# Patient Record
Sex: Female | Born: 1980 | Race: Black or African American | Hispanic: No | Marital: Single | Smoking: Former smoker
Health system: Southern US, Community
[De-identification: ages and names within clinical notes are randomized; demographics above are authoritative.]

## PROBLEM LIST (undated history)

## (undated) DIAGNOSIS — J302 Other seasonal allergic rhinitis: Secondary | ICD-10-CM

## (undated) DIAGNOSIS — I10 Essential (primary) hypertension: Secondary | ICD-10-CM

## (undated) DIAGNOSIS — E669 Obesity, unspecified: Secondary | ICD-10-CM

## (undated) HISTORY — DX: Essential (primary) hypertension: I10

## (undated) HISTORY — DX: Other seasonal allergic rhinitis: J30.2

## (undated) HISTORY — PX: TUBAL LIGATION: SHX77

## (undated) HISTORY — DX: Obesity, unspecified: E66.9

---

## 2004-10-13 ENCOUNTER — Observation Stay: Payer: Self-pay

## 2004-12-07 ENCOUNTER — Observation Stay: Payer: Self-pay

## 2005-01-12 ENCOUNTER — Inpatient Hospital Stay: Payer: Self-pay

## 2009-10-23 ENCOUNTER — Ambulatory Visit: Payer: Self-pay | Admitting: Family Medicine

## 2010-04-30 ENCOUNTER — Ambulatory Visit: Payer: Self-pay | Admitting: Family Medicine

## 2010-10-29 ENCOUNTER — Emergency Department: Payer: Self-pay | Admitting: Emergency Medicine

## 2011-11-16 ENCOUNTER — Emergency Department: Payer: Self-pay | Admitting: Unknown Physician Specialty

## 2013-07-23 ENCOUNTER — Emergency Department: Payer: Self-pay | Admitting: Emergency Medicine

## 2013-07-23 LAB — URINALYSIS, COMPLETE
Nitrite: NEGATIVE
Protein: NEGATIVE
RBC,UR: 2 /HPF (ref 0–5)
Squamous Epithelial: 3
WBC UR: 2 /HPF (ref 0–5)

## 2013-07-23 LAB — CBC
HCT: 38 % (ref 35.0–47.0)
HGB: 12.8 g/dL (ref 12.0–16.0)
Platelet: 208 10*3/uL (ref 150–440)
RBC: 4.36 10*6/uL (ref 3.80–5.20)

## 2013-07-23 LAB — COMPREHENSIVE METABOLIC PANEL
Albumin: 3.9 g/dL (ref 3.4–5.0)
Alkaline Phosphatase: 80 U/L (ref 50–136)
Anion Gap: 4 — ABNORMAL LOW (ref 7–16)
Bilirubin,Total: 0.4 mg/dL (ref 0.2–1.0)
Calcium, Total: 9 mg/dL (ref 8.5–10.1)
Chloride: 110 mmol/L — ABNORMAL HIGH (ref 98–107)
Creatinine: 0.94 mg/dL (ref 0.60–1.30)
EGFR (African American): >60
EGFR (Non-African Amer.): >60
Osmolality: 278 (ref 275–301)
SGOT(AST): 25 U/L (ref 15–37)
SGPT (ALT): 19 U/L (ref 12–78)
Sodium: 140 mmol/L (ref 136–145)
Total Protein: 8.2 g/dL (ref 6.4–8.2)

## 2013-07-23 LAB — LIPASE, BLOOD: Lipase: 147 U/L (ref 73–393)

## 2014-01-19 ENCOUNTER — Emergency Department: Payer: Self-pay | Admitting: Internal Medicine

## 2014-04-24 ENCOUNTER — Emergency Department: Payer: Self-pay | Admitting: Emergency Medicine

## 2014-04-24 LAB — URINALYSIS, COMPLETE
Bacteria: NONE SEEN
Bilirubin,UR: NEGATIVE
Glucose,UR: NEGATIVE mg/dL (ref 0–75)
Leukocyte Esterase: NEGATIVE
Nitrite: NEGATIVE
Ph: 5 (ref 4.5–8.0)
Protein: >=500
RBC,UR: 4 /HPF (ref 0–5)
Specific Gravity: 1.01 (ref 1.003–1.030)
Squamous Epithelial: 1
WBC UR: 2 /HPF (ref 0–5)

## 2014-04-24 LAB — COMPREHENSIVE METABOLIC PANEL
AST: 60 U/L — AB (ref 15–37)
Albumin: 4.1 g/dL (ref 3.4–5.0)
Alkaline Phosphatase: 70 U/L
Anion Gap: 6 — ABNORMAL LOW (ref 7–16)
BUN: 11 mg/dL (ref 7–18)
Bilirubin,Total: 0.4 mg/dL (ref 0.2–1.0)
CO2: 25 mmol/L (ref 21–32)
CREATININE: 1.25 mg/dL (ref 0.60–1.30)
Calcium, Total: 9 mg/dL (ref 8.5–10.1)
Chloride: 102 mmol/L (ref 98–107)
GFR CALC NON AF AMER: 56 — AB
GLUCOSE: 93 mg/dL (ref 65–99)
OSMOLALITY: 265 (ref 275–301)
Potassium: 3.5 mmol/L (ref 3.5–5.1)
SGPT (ALT): 30 U/L (ref 12–78)
Sodium: 133 mmol/L — ABNORMAL LOW (ref 136–145)
Total Protein: 9.1 g/dL — ABNORMAL HIGH (ref 6.4–8.2)

## 2014-04-24 LAB — CBC WITH DIFFERENTIAL/PLATELET
BASOS ABS: 0 10*3/uL (ref 0.0–0.1)
BASOS PCT: 0.7 %
Eosinophil #: 0 10*3/uL (ref 0.0–0.7)
Eosinophil %: 0 %
HCT: 42.9 % (ref 35.0–47.0)
HGB: 14.1 g/dL (ref 12.0–16.0)
Lymphocyte #: 0.7 10*3/uL — ABNORMAL LOW (ref 1.0–3.6)
Lymphocyte %: 32.1 %
MCH: 27.9 pg (ref 26.0–34.0)
MCHC: 32.9 g/dL (ref 32.0–36.0)
MCV: 85 fL (ref 80–100)
Monocyte #: 0.2 x10 3/mm (ref 0.2–0.9)
Monocyte %: 9.9 %
Neutrophil #: 1.3 10*3/uL — ABNORMAL LOW (ref 1.4–6.5)
Neutrophil %: 57.3 %
PLATELETS: 128 10*3/uL — AB (ref 150–440)
RBC: 5.05 10*6/uL (ref 3.80–5.20)
RDW: 13.8 % (ref 11.5–14.5)
WBC: 2.3 10*3/uL — ABNORMAL LOW (ref 3.6–11.0)

## 2014-04-24 LAB — LIPASE, BLOOD: LIPASE: 213 U/L (ref 73–393)

## 2014-04-24 LAB — PREGNANCY, URINE: Pregnancy Test, Urine: NEGATIVE m[IU]/mL

## 2014-04-24 LAB — CLOSTRIDIUM DIFFICILE(ARMC)

## 2014-04-25 LAB — WBCS, STOOL

## 2014-04-27 ENCOUNTER — Emergency Department: Payer: Self-pay | Admitting: Emergency Medicine

## 2015-06-11 IMAGING — CT CT ABD-PELV W/ CM
2 of 4 series · 17 of 46 positions shown, 19 images · IV contrast (isovue)
Comparison: 11/16/2011 CT

CLINICAL DATA: 33-year-old with abdominal and pelvic, nausea,
diarrhea and fever.

EXAM:
CT ABDOMEN AND PELVIS WITH CONTRAST
TECHNIQUE: Multidetector CT imaging of the abdomen and pelvis was performed
using the standard protocol following bolus administration of
intravenous contrast.
CONTRAST:  125 cc intravenous Isovue 370

[Series 2: routine abd pel with · axial · 0.91mm/px · z∈[-644,-200]mm · 14 of 99 slices shown, 16 images]
[im 5/99  soft-tissue]
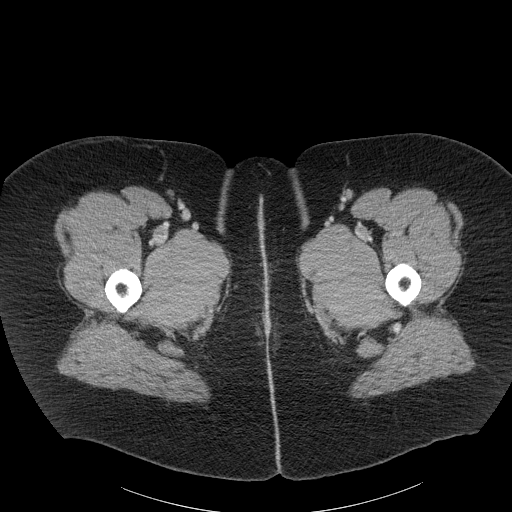
[im 5/99  bone]
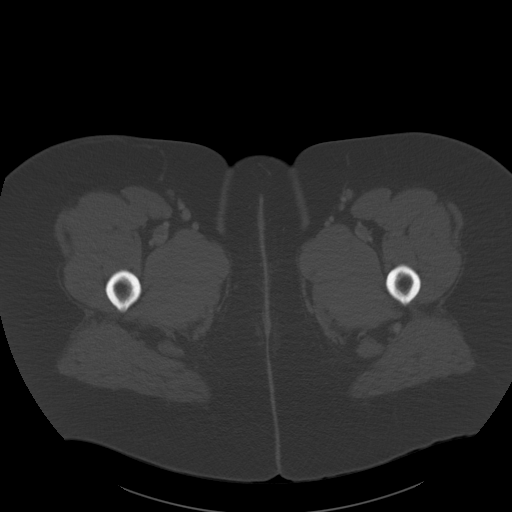
[im 13/99  soft-tissue]
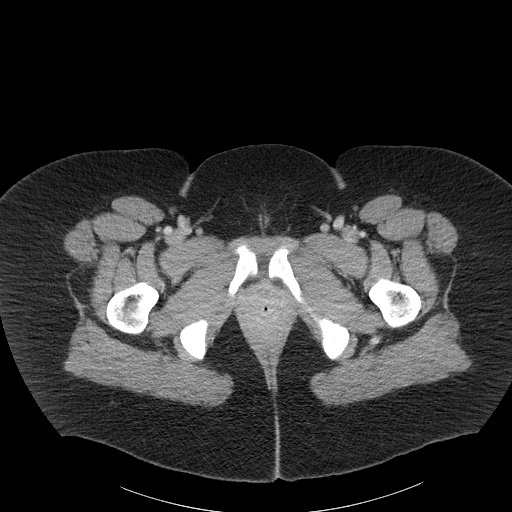
[im 18/99  soft-tissue]
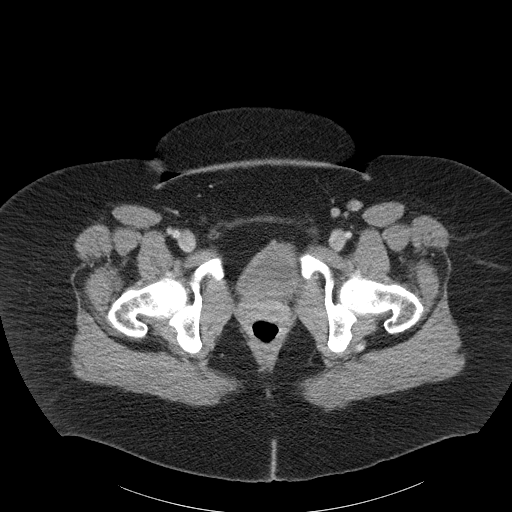
[im 26/99  soft-tissue]
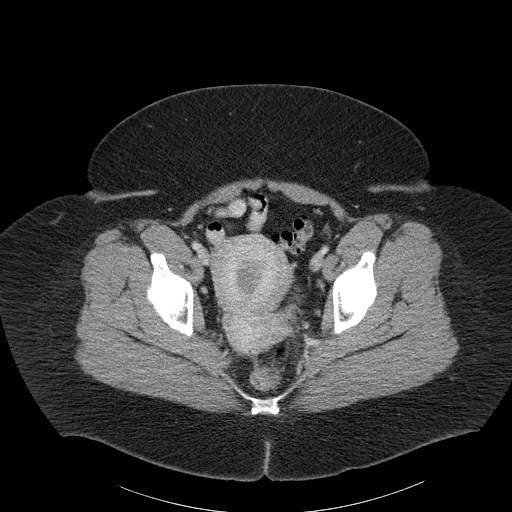
[im 35/99  soft-tissue]
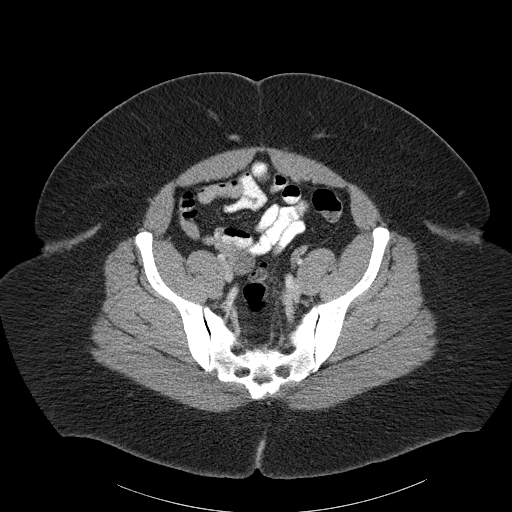
[im 39/99  soft-tissue]
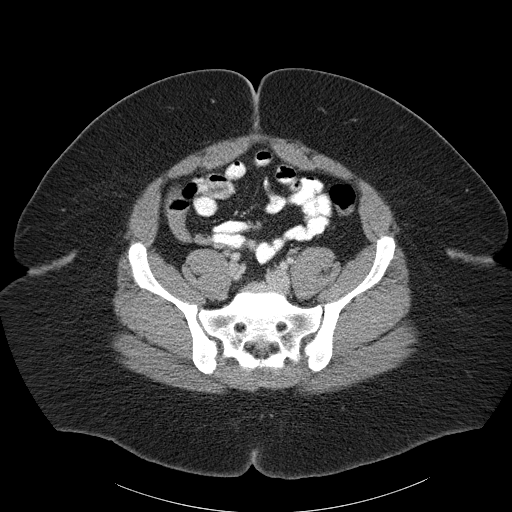
[im 47/99  soft-tissue]
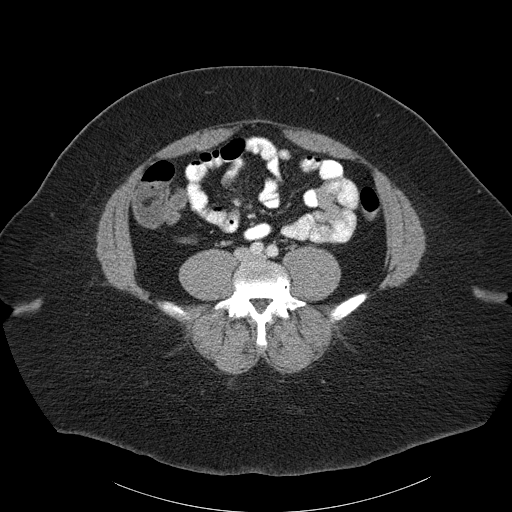
[im 52/99  soft-tissue]
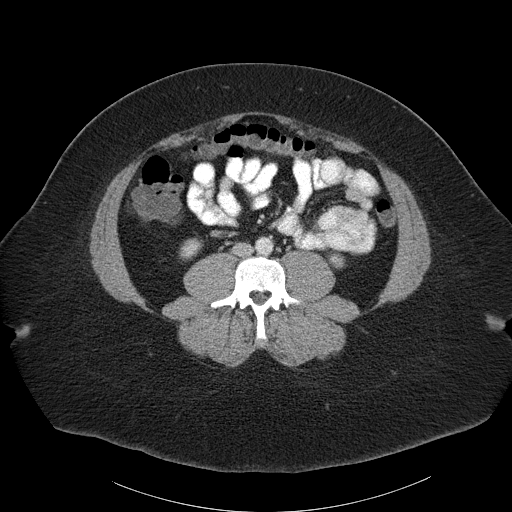
[im 60/99  soft-tissue]
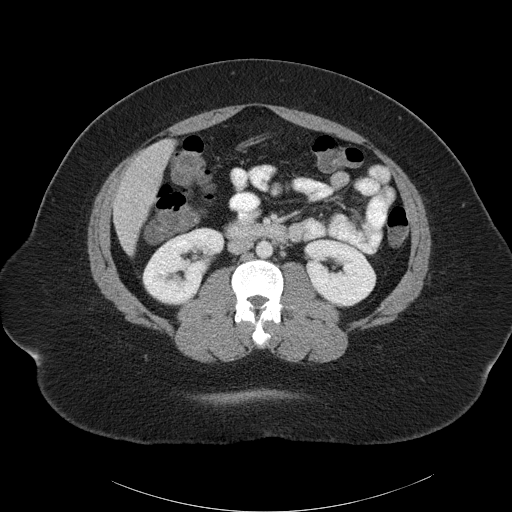
[im 60/99  bone]
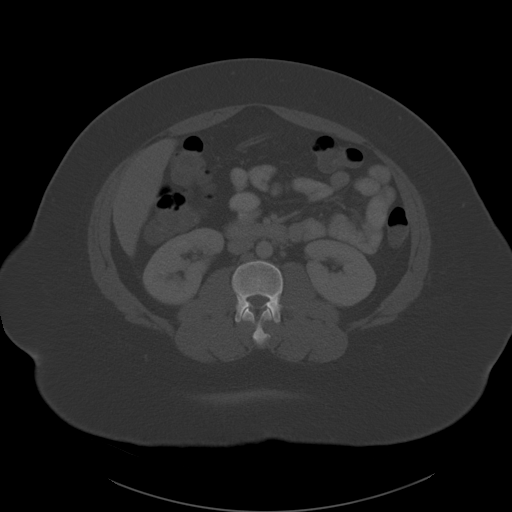
[im 64/99  soft-tissue]
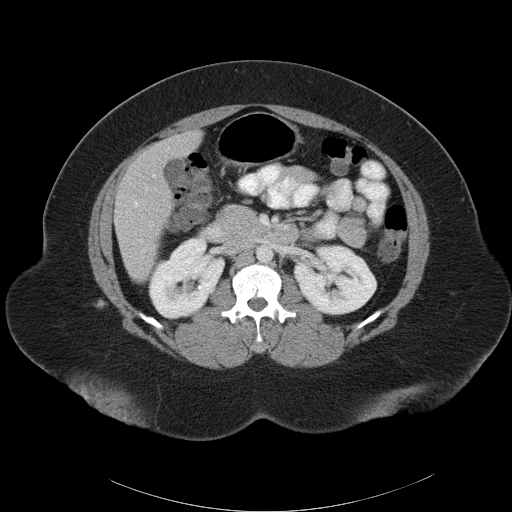
[im 73/99  soft-tissue]
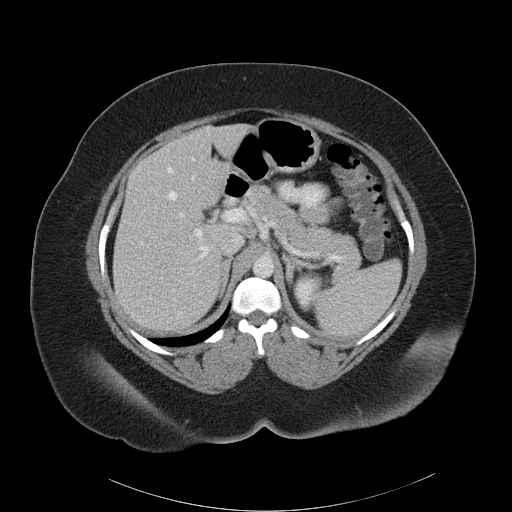
[im 81/99  soft-tissue]
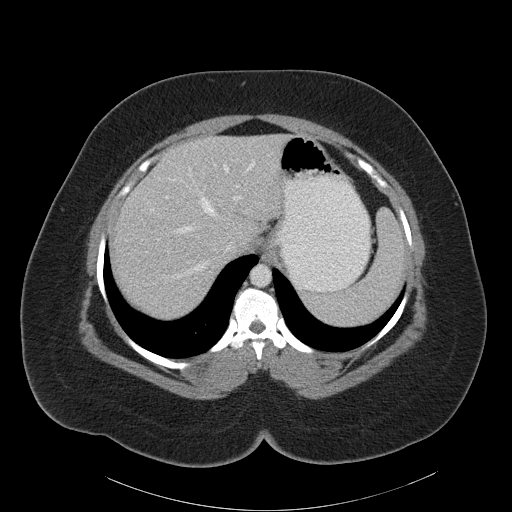
[im 86/99  soft-tissue]
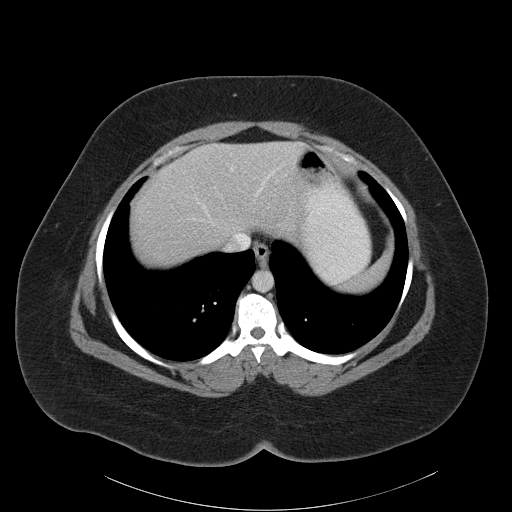
[im 94/99  soft-tissue]
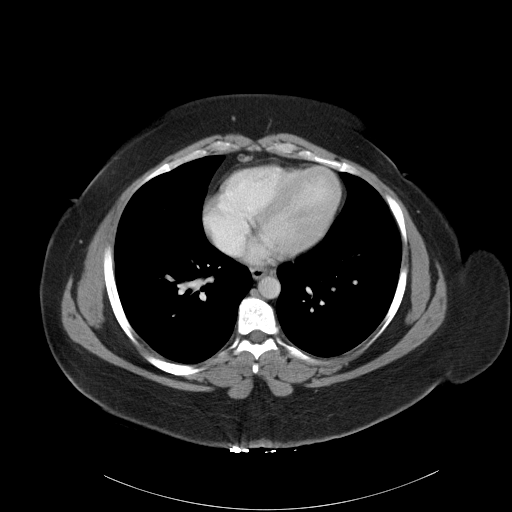

[Series 5: cor routine abd pel with · coronal · 0.92mm/px · 3 of 138 slices shown]
[im 46/138  soft-tissue]
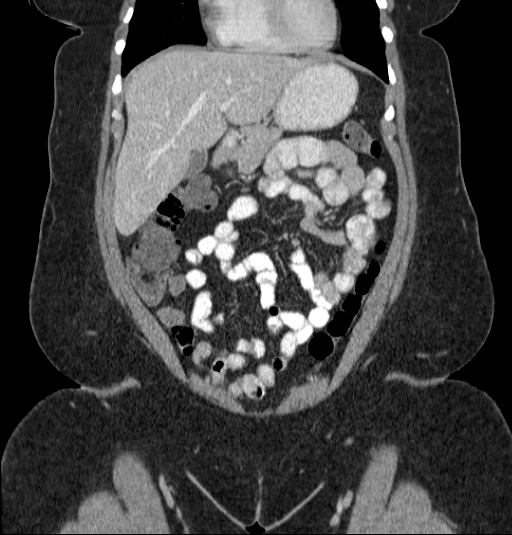
[im 61/138  soft-tissue]
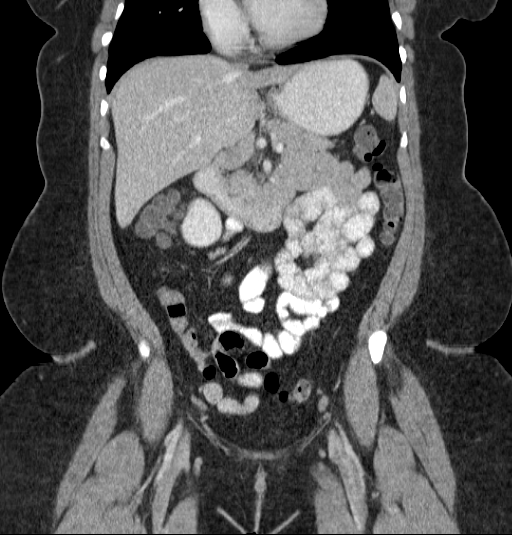
[im 77/138  soft-tissue]
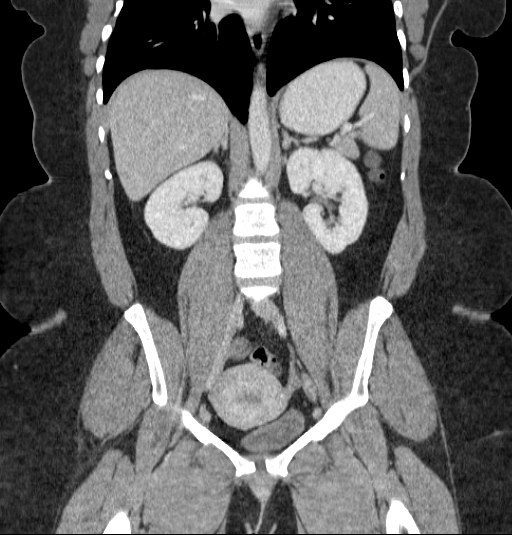

[17 of 46 positions shown; findings below may reference images not displayed]

FINDINGS: The liver, spleen, pancreas, adrenal glands, gallbladder and kidneys
are unremarkable.

There is no evidence of free fluid, enlarged lymph nodes, biliary
dilation or abdominal aortic aneurysm.

The bowel, bladder and appendix are unremarkable. There is no
evidence of bowel obstruction, abscess or pneumoperitoneum.

The uterus and adnexal regions are unremarkable.

No acute or suspicious bony abnormalities are identified.
IMPRESSION: No evidence of acute or significant abnormality.

## 2021-01-19 ENCOUNTER — Encounter: Payer: Self-pay | Admitting: Intensive Care

## 2021-01-19 ENCOUNTER — Other Ambulatory Visit: Payer: Self-pay

## 2021-01-19 ENCOUNTER — Emergency Department
Admission: EM | Admit: 2021-01-19 | Discharge: 2021-01-19 | Disposition: A | Discharge status: Home / Self Care | Payer: Self-pay | Attending: Emergency Medicine | Admitting: Emergency Medicine

## 2021-01-19 DIAGNOSIS — L0201 Cutaneous abscess of face: Secondary | ICD-10-CM | POA: Insufficient documentation

## 2021-01-19 DIAGNOSIS — Z79899 Other long term (current) drug therapy: Secondary | ICD-10-CM | POA: Insufficient documentation

## 2021-01-19 DIAGNOSIS — R519 Headache, unspecified: Secondary | ICD-10-CM | POA: Insufficient documentation

## 2021-01-19 DIAGNOSIS — F17210 Nicotine dependence, cigarettes, uncomplicated: Secondary | ICD-10-CM | POA: Insufficient documentation

## 2021-01-19 MED ORDER — TRAMADOL HCL 50 MG PO TABS: 50.0000 mg | ORAL_TABLET | Freq: Four times a day (QID) | ORAL | 0 refills | Status: DC | PRN

## 2021-01-19 MED ORDER — SULFAMETHOXAZOLE-TRIMETHOPRIM 800-160 MG PO TABS: 1.0000 | ORAL_TABLET | Freq: Two times a day (BID) | ORAL | 0 refills | Status: DC

## 2021-01-19 NOTE — ED Triage Notes (Signed)
Patient c/o boil that presented on right side of face 4 days ago and now swelling. C/o headaches intermittently.

## 2021-01-19 NOTE — ED Provider Notes (Signed)
[facility] Emergency Department Provider Note  ____________________________________________   Event Date/Time   First MD Initiated Contact with Patient 01/19/21 1209     (approximate)  I have reviewed the triage vital signs and the nursing notes.   HISTORY  Chief Complaint Abscess   HPI Stacy Davenport is a 40 y.o. female presents to the ED with complaint of a tenderness to the right side of her face that started 4 days ago and now is swelling.  Patient denies any injury to her face.  She denies any fever, chills, nausea or vomiting.  She rates her pain as an 8 out of 10.       History reviewed. No pertinent past medical history.  There are no problems to display for this patient.   History reviewed. No pertinent surgical history.  Prior to Admission medications   Medication Sig Start Date End Date Taking? Authorizing Provider  sulfamethoxazole-trimethoprim (BACTRIM DS) 800-160 MG tablet Take 1 tablet by mouth 2 (two) times daily. 01/19/21  Yes Tommi Rumps, PA-C  traMADol (ULTRAM) 50 MG tablet Take 1 tablet (50 mg total) by mouth every 6 (six) hours as needed. 01/19/21  Yes Tommi Rumps, PA-C    Allergies Patient has no known allergies.  History reviewed. No pertinent family history.  Social History Social History   Tobacco Use  . Smoking status: Current Some Day Smoker    Types: Cigarettes  . Smokeless tobacco: Never Used  Substance Use Topics  . Alcohol use: Yes    Alcohol/week: 4.0 standard drinks    Types: 2 Cans of beer, 2 Shots of liquor per week  . Drug use: Never    Review of Systems Constitutional: No fever/chills Eyes: No visual changes. ENT: No sore throat. Cardiovascular: Denies chest pain. Respiratory: Denies shortness of breath. Gastrointestinal:  No nausea, no vomiting.  Musculoskeletal: Negative for back pain. Skin: Positive for boil on face. Neurological: Positive for headache.  Negative for focal  weakness or numbness. ____________________________________________   PHYSICAL EXAM:  VITAL SIGNS: ED Triage Vitals  Enc Vitals Group     BP 01/19/21 1147 (!) 164/108     Pulse Rate 01/19/21 1147 (!) 109     Resp 01/19/21 1147 20     Temp 01/19/21 1147 98.7 F (37.1 C)     Temp Source 01/19/21 1147 Oral     SpO2 01/19/21 1147 99 %     Weight 01/19/21 1148 (!) 350 lb (158.8 kg)     Height 01/19/21 1148 5\' 9"  (1.753 m)     Head Circumference --      Peak Flow --      Pain Score 01/19/21 1148 8     Pain Loc --      Pain Edu? --      Excl. in GC? --     Constitutional: Alert and oriented. Well appearing and in no acute distress. Eyes: Conjunctivae are normal.  Head: Atraumatic. Nose: No congestion/rhinnorhea. Neck: No stridor.   Cardiovascular: Normal rate, regular rhythm. Grossly normal heart sounds.  Good peripheral circulation. Respiratory: Normal respiratory effort.  No retractions. Lungs CTAB. Musculoskeletal: Moves upper and lower extremities do not difficulty.  Normal gait was noted. Neurologic:  Normal speech and language. No gross focal neurologic deficits are appreciated.  Skin:  Skin is warm, dry and intact.  On the right lateral face near hairline there is appearance of a possible blackhead.  Area surrounding this is extremely firm and tender  to palpation.  No active drainage.  No fluctuance is appreciated. Psychiatric: Mood and affect are normal. Speech and behavior are normal.  ____________________________________________   LABS (all labs ordered are listed, but only abnormal results are displayed)  Labs Reviewed - No data to display ____________________________________________  PROCEDURES  Procedure(s) performed (including Critical Care):  Procedures   ____________________________________________   INITIAL IMPRESSION / ASSESSMENT AND PLAN / ED COURSE  As part of my medical decision making, I reviewed the following data within the electronic medical  record:  Notes from prior ED visits and [place] Controlled Substance Database  40 year old female presents to the ED with a facial abscess to the right side of her face for the last 4 days.  There was no fluctuant area in which to drain and patient was made aware.  She will begin using warm moist compresses to the area frequently.  She also was sent a prescription for Bactrim DS twice daily for 10 days and tramadol as needed for pain.  She is aware that she can return to the emergency department for I&D if area is not improving in the next 2 days.  She also may follow-up with her PCP if needed. ____________________________________________   FINAL CLINICAL IMPRESSION(S) / ED DIAGNOSES  Final diagnoses:  Facial abscess     ED Discharge Orders         Ordered    sulfamethoxazole-trimethoprim (BACTRIM DS) 800-160 MG tablet  2 times daily        01/19/21 1225    traMADol (ULTRAM) 50 MG tablet  Every 6 hours PRN        01/19/21 1225          *Please note:  Stacy Davenport was evaluated in Emergency Department on 01/19/2021 for the symptoms described in the history of present illness. She was evaluated in the context of the global COVID-19 pandemic, which necessitated consideration that the patient might be at risk for infection with the SARS-CoV-2 virus that causes COVID-19. Institutional protocols and algorithms that pertain to the evaluation of patients at risk for COVID-19 are in a state of rapid change based on information released by regulatory bodies including the CDC and federal and state organizations. These policies and algorithms were followed during the patient's care in the ED.  Some ED evaluations and interventions may be delayed as a result of limited staffing during and the pandemic.*   Note:  This document was prepared using Dragon voice recognition software and may include unintentional dictation errors.    Tommi Rumps, PA-C 01/19/21 1641    Chesley Noon, MD 01/20/21  [phone]

## 2021-01-19 NOTE — Discharge Instructions (Addendum)
Begin taking antibiotics and continue using warm moist compresses to the area frequently.  A prescription for pain medication was sent to the pharmacy along with your antibiotics.  Do not drink or operate machinery such as driving while taking the pain medication.  You may call make an appointment with the surgeon listed on your discharge papers.  Return to the emergency department if any severe worsening of your abscess.  This area may open and drain on its own after using warm compresses.

## 2021-01-22 ENCOUNTER — Encounter: Payer: Self-pay | Admitting: Emergency Medicine

## 2021-01-22 ENCOUNTER — Emergency Department
Admission: EM | Admit: 2021-01-22 | Discharge: 2021-01-22 | Disposition: A | Discharge status: Home / Self Care | Payer: Self-pay | Attending: Emergency Medicine | Admitting: Emergency Medicine

## 2021-01-22 ENCOUNTER — Other Ambulatory Visit: Payer: Self-pay

## 2021-01-22 DIAGNOSIS — L0201 Cutaneous abscess of face: Secondary | ICD-10-CM | POA: Insufficient documentation

## 2021-01-22 DIAGNOSIS — F17210 Nicotine dependence, cigarettes, uncomplicated: Secondary | ICD-10-CM | POA: Insufficient documentation

## 2021-01-22 DIAGNOSIS — I10 Essential (primary) hypertension: Secondary | ICD-10-CM | POA: Insufficient documentation

## 2021-01-22 MED ORDER — AMLODIPINE BESYLATE 5 MG PO TABS: 5.0000 mg | ORAL_TABLET | Freq: Every day | ORAL | 0 refills | Status: DC

## 2021-01-22 MED ORDER — CEPHALEXIN 500 MG PO CAPS
1000.0000 mg | ORAL_CAPSULE | Freq: Once | ORAL | Status: AC
  Administered 2021-01-22: 1000 mg via ORAL
  Filled 2021-01-22: qty 2

## 2021-01-22 MED ORDER — LIDOCAINE HCL (PF) 1 % IJ SOLN
5.0000 mL | Freq: Once | INTRAMUSCULAR | Status: AC
  Administered 2021-01-22: 5 mL via INTRADERMAL
  Filled 2021-01-22: qty 5

## 2021-01-22 MED ORDER — CEPHALEXIN 500 MG PO CAPS: 1000.0000 mg | ORAL_CAPSULE | Freq: Two times a day (BID) | ORAL | 0 refills | Status: AC

## 2021-01-22 NOTE — ED Triage Notes (Signed)
Pt seen in ED on Saturday for an abscess to the right side of forehead at temple. Pt told to come back for drainage when area became soft and to a head. Area is soft and skin thin on top of swelling. Pt also told that she needed to follow up for her blood pressure if continued to read high. Pts BP elevated in triage at 189/94. Pt denies any symptoms.   Swelling noted: No redness or drainage.

## 2021-01-22 NOTE — Discharge Instructions (Addendum)
Please continue the Bactrim previously prescribed. Add the keflex prescribed for an additional 7days. Return for any worsening.   You were also prescribed amlodipine for your high blood pressure. Please follow up with primary care as soon as possible.

## 2021-01-23 NOTE — ED Provider Notes (Signed)
[facility] Emergency Department Provider Note  ____________________________________________   Event Date/Time   First MD Initiated Contact with Patient 01/22/21 2000     (approximate)  I have reviewed the triage vital signs and the nursing notes.   HISTORY  Chief Complaint Abscess  HPI Stacy Davenport is a 40 y.o. female who reports to the emergency department for evaluation of abscess to right side of face.  Patient was seen here 3 days ago and started on Bactrim, was told that the abscess at that time was not amenable to drainage.  She is presenting today because the pain continues to worsen despite antibiotic therapy.  She does not note any fever, no.  Of note, she was told when she was here 3 days ago that her blood pressure was high and she needed follow-up with primary care, she is not unable to do so yet.  In triage her blood pressure was noted to be markedly elevated, but she denies any chest pain, headache, lightheadedness or any other symptoms.       History reviewed. No pertinent past medical history.  There are no problems to display for this patient.   History reviewed. No pertinent surgical history.  Prior to Admission medications   Medication Sig Start Date End Date Taking? Authorizing Provider  amLODipine (NORVASC) 5 MG tablet Take 1 tablet (5 mg total) by mouth daily. 01/22/21 02/21/21 Yes Montrice Montuori, Ruben Gottron, PA  cephALEXin (KEFLEX) 500 MG capsule Take 2 capsules (1,000 mg total) by mouth 2 (two) times daily for 7 days. 01/22/21 01/29/21 Yes Braylin Formby, Ruben Gottron, PA  sulfamethoxazole-trimethoprim (BACTRIM DS) 800-160 MG tablet Take 1 tablet by mouth 2 (two) times daily. 01/19/21   Tommi Rumps, PA-C  traMADol (ULTRAM) 50 MG tablet Take 1 tablet (50 mg total) by mouth every 6 (six) hours as needed. 01/19/21   Tommi Rumps, PA-C    Allergies Patient has no known allergies.  History reviewed. No pertinent family history.  Social  History Social History   Tobacco Use  . Smoking status: Current Some Day Smoker    Types: Cigarettes  . Smokeless tobacco: Never Used  Substance Use Topics  . Alcohol use: Yes    Alcohol/week: 4.0 standard drinks    Types: 2 Cans of beer, 2 Shots of liquor per week  . Drug use: Never    Review of Systems Constitutional: No fever/chills Eyes: No visual changes. ENT: No sore throat. Cardiovascular: Denies chest pain. Respiratory: Denies shortness of breath. Gastrointestinal: No abdominal pain.  No nausea, no vomiting.  No diarrhea.  No constipation. Genitourinary: Negative for dysuria. Musculoskeletal: Negative for back pain. Skin: + Abscess right side of face, negative for rash. Neurological: Negative for headaches, focal weakness or numbness.   ____________________________________________   PHYSICAL EXAM:  VITAL SIGNS: ED Triage Vitals  Enc Vitals Group     BP 01/22/21 1951 (!) 189/94     Pulse Rate 01/22/21 1951 91     Resp 01/22/21 1951 18     Temp 01/22/21 1951 98 F (36.7 C)     Temp Source 01/22/21 1951 Oral     SpO2 01/22/21 1951 100 %     Weight 01/22/21 1952 (!) 329 lb (149.2 kg)     Height 01/22/21 1952 5\' 9"  (1.753 m)     Head Circumference --      Peak Flow --      Pain Score 01/22/21 2204 7     Pain Loc --  Pain Edu? --      Excl. in GC? --    Constitutional: Alert and oriented. Well appearing and in no acute distress. Eyes: Conjunctivae are normal. PERRL. EOMI. Face: There is a 1.5 cm x 1.5 cm raised area on the right side of face, just anterior to the tragus of the ear.  It is fluctuant.  No surrounding induration noted. Nose: No congestion/rhinnorhea. Mouth/Throat: Mucous membranes are moist.   Neck: No stridor.   Lymphatic: No cervical lymphadenopathy Cardiovascular: Normal rate, regular rhythm. Grossly normal heart sounds.  Good peripheral circulation. Respiratory: Normal respiratory effort.  No retractions. Lungs CTAB. Musculoskeletal:  No lower extremity tenderness nor edema.  No joint effusions. Neurologic:  Normal speech and language. No gross focal neurologic deficits are appreciated. No gait instability. Skin:  Skin is warm, dry and intact. No rash noted. Psychiatric: Mood and affect are normal. Speech and behavior are normal.  ____________________________________________   PROCEDURES  Procedure(s) performed (including Critical Care):  Marland KitchenMarland KitchenIncision and Drainage  Date/Time: 01/23/2021 4:10 PM Performed by: Lucy Chris, PA Authorized by: Lucy Chris, PA   Consent:    Consent obtained:  Verbal   Consent given by:  Patient   Risks, benefits, and alternatives were discussed: yes     Risks discussed:  Bleeding, incomplete drainage, infection and pain   Alternatives discussed:  No treatment, observation and referral Universal protocol:    Procedure explained and questions answered to patient or proxy's satisfaction: yes     Patient identity confirmed:  Verbally with patient Location:    Type:  Abscess   Location:  Head   Head location:  Face Pre-procedure details:    Skin preparation:  Povidone-iodine Sedation:    Sedation type:  None Anesthesia:    Anesthesia method:  Local infiltration   Local anesthetic:  Lidocaine 1% w/o epi Procedure type:    Complexity:  Simple Procedure details:    Incision types:  Stab incision and cruciate   Wound management:  Probed and deloculated   Drainage:  Bloody and purulent   Drainage amount:  Moderate   Wound treatment:  Wound left open   Packing materials:  None Post-procedure details:    Procedure completion:  Tolerated well, no immediate complications     ____________________________________________   INITIAL IMPRESSION / ASSESSMENT AND PLAN / ED COURSE  As part of my medical decision making, I reviewed the following data within the electronic MEDICAL RECORD NUMBER Nursing notes reviewed and incorporated and Notes from prior ED visits         Patient is a 40 year old female who presents to the emergency department for evaluation of abscess to right side of face that has failed oral antibiotics.  See HPI for further details.  In triage, the patient is noted to have a markedly elevated blood pressure at 189/94.  She is asymptomatic of the blood pressure at this time with no chest pain, shortness of breath, headache or paresthesias.  On physical exam, she does have a noted 1.5 cm x 1.5 cm abscess to the right side of her face with noted fluctuance.  Risk and benefits of I&D were discussed with the patient.  Given that she has failed oral Bactrim at this time, feel is amenable to attempt drainage.  A moderate amount of drainage was able to be expressed, however there was still noted induration underneath of the fluctuant area that was removed.  We will place the patient on additional Keflex for dual coverage for the  remaining 7 days of her Bactrim therapy.  In addition, discussed the patient's blood pressure at length with her.  She does not currently have an appointment set up with primary care as she had a lapse in insurance coverage.  Will start the patient on 5 mg amlodipine daily.  In the interim, discussed the importance of primary care follow-up.  Also stressed the importance of return to the ED should she develop any headache, numbness, paresthesias, chest pain, shortness of breath or other acute symptoms.  Patient is amenable with this plan she stable this time for outpatient therapy.      ____________________________________________   FINAL CLINICAL IMPRESSION(S) / ED DIAGNOSES  Final diagnoses:  Abscess of face  Hypertension, unspecified type     ED Discharge Orders         Ordered    cephALEXin (KEFLEX) 500 MG capsule  2 times daily        01/22/21 2105    amLODipine (NORVASC) 5 MG tablet  Daily        01/22/21 2105          *Please note:  DAPHNEY HOPKE was evaluated in Emergency Department on 01/23/2021 for the  symptoms described in the history of present illness. She was evaluated in the context of the global COVID-19 pandemic, which necessitated consideration that the patient might be at risk for infection with the SARS-CoV-2 virus that causes COVID-19. Institutional protocols and algorithms that pertain to the evaluation of patients at risk for COVID-19 are in a state of rapid change based on information released by regulatory bodies including the CDC and federal and state organizations. These policies and algorithms were followed during the patient's care in the ED.  Some ED evaluations and interventions may be delayed as a result of limited staffing during and the pandemic.*   Note:  This document was prepared using Dragon voice recognition software and may include unintentional dictation errors.   Lucy Chris, PA 01/23/21 1613    Gilles Chiquito, MD 01/23/21 [phone]

## 2021-02-18 ENCOUNTER — Ambulatory Visit: Payer: Self-pay | Admitting: Surgery

## 2021-04-09 ENCOUNTER — Ambulatory Visit: Payer: Self-pay | Admitting: Gerontology

## 2021-04-09 ENCOUNTER — Encounter: Payer: Self-pay | Admitting: Gerontology

## 2021-04-09 ENCOUNTER — Other Ambulatory Visit: Payer: Self-pay

## 2021-04-09 VITALS — BP 142/84 | HR 97 | Temp 97.2°F | Resp 18 | Ht 67.5 in | Wt 391.6 lb

## 2021-04-09 DIAGNOSIS — Z8639 Personal history of other endocrine, nutritional and metabolic disease: Secondary | ICD-10-CM | POA: Insufficient documentation

## 2021-04-09 DIAGNOSIS — Z7689 Persons encountering health services in other specified circumstances: Secondary | ICD-10-CM

## 2021-04-09 DIAGNOSIS — I10 Essential (primary) hypertension: Secondary | ICD-10-CM

## 2021-04-09 MED ORDER — AMLODIPINE BESYLATE 5 MG PO TABS
5.0000 mg | ORAL_TABLET | Freq: Every day | ORAL | 0 refills | Status: DC
  Filled 2021-04-09: qty 30, 30d supply, fill #0

## 2021-04-09 NOTE — Patient Instructions (Signed)
Calorie Counting for Weight Loss Calories are units of energy. Your body needs a certain number of calories from food to keep going throughout the day. When you eat or drink more calories than your body needs, your body stores the extra calories mostly as fat. When you eat or drink fewer calories than your body needs, your body burns fat to get the energy it needs. Calorie counting means keeping track of how many calories you eat and drink each day. Calorie counting can be helpful if you need to lose weight. If you eat fewer calories than your body needs, you should lose weight. Ask your health care provider what a healthy weight is for you. For calorie counting to work, you will need to eat the right number of calories each day to lose a healthy amount of weight per week. A dietitian can help you figure out how many calories you need in a day and will suggest ways to reach your calorie goal.  A healthy amount of weight to lose each week is usually 1-2 lb (0.5-0.9 kg). This usually means that your daily calorie intake should be reduced by 500-750 calories.  Eating 1,200-1,500 calories a day can help most women lose weight.  Eating 1,500-1,800 calories a day can help most men lose weight. What do I need to know about calorie counting? Work with your health care provider or dietitian to determine how many calories you should get each day. To meet your daily calorie goal, you will need to:  Find out how many calories are in each food that you would like to eat. Try to do this before you eat.  Decide how much of the food you plan to eat.  Keep a food log. Do this by writing down what you ate and how many calories it had. To successfully lose weight, it is important to balance calorie counting with a healthy lifestyle that includes regular activity. Where do I find calorie information? The number of calories in a food can be found on a Nutrition Facts label. If a food does not have a Nutrition Facts  label, try to look up the calories online or ask your dietitian for help. Remember that calories are listed per serving. If you choose to have more than one serving of a food, you will have to multiply the calories per serving by the number of servings you plan to eat. For example, the label on a package of bread might say that a serving size is 1 slice and that there are 90 calories in a serving. If you eat 1 slice, you will have eaten 90 calories. If you eat 2 slices, you will have eaten 180 calories.   How do I keep a food log? After each time that you eat, record the following in your food log as soon as possible:  What you ate. Be sure to include toppings, sauces, and other extras on the food.  How much you ate. This can be measured in cups, ounces, or number of items.  How many calories were in each food and drink.  The total number of calories in the food you ate. Keep your food log near you, such as in a pocket-sized notebook or on an app or website on your mobile phone. Some programs will calculate calories for you and show you how many calories you have left to meet your daily goal. What are some portion-control tips?  Know how many calories are in a serving. This will   help you know how many servings you can have of a certain food.  Use a measuring cup to measure serving sizes. You could also try weighing out portions on a kitchen scale. With time, you will be able to estimate serving sizes for some foods.  Take time to put servings of different foods on your favorite plates or in your favorite bowls and cups so you know what a serving looks like.  Try not to eat straight from a food's packaging, such as from a bag or box. Eating straight from the package makes it hard to see how much you are eating and can lead to overeating. Put the amount you would like to eat in a cup or on a plate to make sure you are eating the right portion.  Use smaller plates, glasses, and bowls for smaller  portions and to prevent overeating.  Try not to multitask. For example, avoid watching TV or using your computer while eating. If it is time to eat, sit down at a table and enjoy your food. This will help you recognize when you are full. It will also help you be more mindful of what and how much you are eating. What are tips for following this plan? Reading food labels  Check the calorie count compared with the serving size. The serving size may be smaller than what you are used to eating.  Check the source of the calories. Try to choose foods that are high in protein, fiber, and vitamins, and low in saturated fat, trans fat, and sodium. Shopping  Read nutrition labels while you shop. This will help you make healthy decisions about which foods to buy.  Pay attention to nutrition labels for low-fat or fat-free foods. These foods sometimes have the same number of calories or more calories than the full-fat versions. They also often have added sugar, starch, or salt to make up for flavor that was removed with the fat.  Make a grocery list of lower-calorie foods and stick to it. Cooking  Try to cook your favorite foods in a healthier way. For example, try baking instead of frying.  Use low-fat dairy products. Meal planning  Use more fruits and vegetables. One-half of your plate should be fruits and vegetables.  Include lean proteins, such as chicken, turkey, and fish. Lifestyle Each week, aim to do one of the following:  150 minutes of moderate exercise, such as walking.  75 minutes of vigorous exercise, such as running. General information  Know how many calories are in the foods you eat most often. This will help you calculate calorie counts faster.  Find a way of tracking calories that works for you. Get creative. Try different apps or programs if writing down calories does not work for you. What foods should I eat?  Eat nutritious foods. It is better to have a nutritious,  high-calorie food, such as an avocado, than a food with few nutrients, such as a bag of potato chips.  Use your calories on foods and drinks that will fill you up and will not leave you hungry soon after eating. ? Examples of foods that fill you up are nuts and nut butters, vegetables, lean proteins, and high-fiber foods such as whole grains. High-fiber foods are foods with more than 5 g of fiber per serving.  Pay attention to calories in drinks. Low-calorie drinks include water and unsweetened drinks. The items listed above may not be a complete list of foods and beverages you can eat.   Contact a dietitian for more information.   What foods should I limit? Limit foods or drinks that are not good sources of vitamins, minerals, or protein or that are high in unhealthy fats. These include:  Candy.  Other sweets.  Sodas, specialty coffee drinks, alcohol, and juice. The items listed above may not be a complete list of foods and beverages you should avoid. Contact a dietitian for more information. How do I count calories when eating out?  Pay attention to portions. Often, portions are much larger when eating out. Try these tips to keep portions smaller: ? Consider sharing a meal instead of getting your own. ? If you get your own meal, eat only half of it. Before you start eating, ask for a container and put half of your meal into it. ? When available, consider ordering smaller portions from the menu instead of full portions.  Pay attention to your food and drink choices. Knowing the way food is cooked and what is included with the meal can help you eat fewer calories. ? If calories are listed on the menu, choose the lower-calorie options. ? Choose dishes that include vegetables, fruits, whole grains, low-fat dairy products, and lean proteins. ? Choose items that are boiled, broiled, grilled, or steamed. Avoid items that are buttered, battered, fried, or served with cream sauce. Items labeled as  crispy are usually fried, unless stated otherwise. ? Choose water, low-fat milk, unsweetened iced tea, or other drinks without added sugar. If you want an alcoholic beverage, choose a lower-calorie option, such as a glass of wine or light beer. ? Ask for dressings, sauces, and syrups on the side. These are usually high in calories, so you should limit the amount you eat. ? If you want a salad, choose a garden salad and ask for grilled meats. Avoid extra toppings such as bacon, cheese, or fried items. Ask for the dressing on the side, or ask for olive oil and vinegar or lemon to use as dressing.  Estimate how many servings of a food you are given. Knowing serving sizes will help you be aware of how much food you are eating at restaurants. Where to find more information  Centers for Disease Control and Prevention: www.cdc.gov  [place] Department of Agriculture: myplate.gov Summary  Calorie counting means keeping track of how many calories you eat and drink each day. If you eat fewer calories than your body needs, you should lose weight.  A healthy amount of weight to lose per week is usually 1-2 lb (0.5-0.9 kg). This usually means reducing your daily calorie intake by 500-750 calories.  The number of calories in a food can be found on a Nutrition Facts label. If a food does not have a Nutrition Facts label, try to look up the calories online or ask your dietitian for help.  Use smaller plates, glasses, and bowls for smaller portions and to prevent overeating.  Use your calories on foods and drinks that will fill you up and not leave you hungry shortly after a meal. This information is not intended to replace advice given to you by your health care provider. Make sure you discuss any questions you have with your health care provider. Document Revised: 01/05/2020 Document Reviewed: 01/05/2020 Elsevier Patient Education  2021 [employer] Inc.  

## 2021-04-09 NOTE — Progress Notes (Signed)
New Patient Office Visit  Subjective:  Patient ID: Stacy Davenport, female    DOB: 1981-03-11  Age: 40 y.o. MRN: [id number]  CC:  Chief Complaint  Patient presents with  . Establish Care    HPI Stacy Davenport a 40 y/o female with history of Hypertension, Obesity and Seasonal allergy, presents to establish care and evaluation of her chronic conditions. During her ED visit on 01/22/21, her blood pressure was elevated and was started on 5 mg Amlodipine. She states that she self discontinued medication because she wasn't feeling well. She doesn't check her blood pressure at home and it was elevated during visit. Currently, she c/o intermittent light headedness, especially when changing from a lying to a sitting position. She denies vertigo , vision changes and tinnitus. She also states that she was told that she has Thyroid problems, but had no test done. She reports fatigue, weight gain, cold and heat intolerance. Overall, she states that she's doing well and offers no further complaint.  Past Medical History:  Diagnosis Date  . Hypertension   . Obesity   . Seasonal allergies     Past Surgical History:  Procedure Laterality Date  . TUBAL LIGATION      Family History  Problem Relation Age of Onset  . Stomach cancer Mother   . Healthy Father   . Congenital heart disease Daughter   . Diabetes Maternal Grandmother   . Hypertension Maternal Grandmother     Social History   Socioeconomic History  . Marital status: Single    Spouse name: Not on file  . Number of children: Not on file  . Years of education: Not on file  . Highest education level: Not on file  Occupational History  . Not on file  Tobacco Use  . Smoking status: Current Some Day Smoker    Types: Cigarettes  . Smokeless tobacco: Never Used  . Tobacco comment: 1-2 cigarettes weekly  Vaping Use  . Vaping Use: Never used  Substance and Sexual Activity  . Alcohol use: Yes    Comment: social  . Drug use: Never  .  Sexual activity: Yes    Birth control/protection: None  Other Topics Concern  . Not on file  Social History Narrative  . Not on file   Social Determinants of Health   Financial Resource Strain: Not on file  Food Insecurity: No Food Insecurity  . Worried About Charity fundraiser in the Last Year: Never true  . Ran Out of Food in the Last Year: Never true  Transportation Needs: No Transportation Needs  . Lack of Transportation (Medical): No  . Lack of Transportation (Non-Medical): No  Physical Activity: Not on file  Stress: Not on file  Social Connections: Not on file  Intimate Partner Violence: Not on file    ROS Review of Systems  Constitutional: Positive for fatigue.  HENT: Negative.   Eyes: Negative.   Respiratory: Negative.   Cardiovascular: Negative.   Gastrointestinal: Negative.   Endocrine: Positive for cold intolerance and heat intolerance.  Genitourinary: Negative.   Musculoskeletal: Negative.   Skin: Negative.   Neurological: Positive for light-headedness.  Hematological: Negative.   Psychiatric/Behavioral: Negative.     Objective:   Today's Vitals: BP (!) 142/84 (BP Location: Left Arm, Patient Position: Sitting, Cuff Size: Large) Comment (BP Location): forearm  Pulse 97   Temp (!) 97.2 F (36.2 C)   Resp 18   Ht 5' 7.5" (1.715 m)   Wt (!) 391  lb 9.6 oz (177.6 kg)   LMP 04/03/2021 (Exact Date)   SpO2 99%   BMI 60.43 kg/m   Physical Exam HENT:     Head: Normocephalic and atraumatic.     Nose: Nose normal.     Mouth/Throat:     Mouth: Mucous membranes are moist.  Eyes:     Extraocular Movements: Extraocular movements intact.     Conjunctiva/sclera: Conjunctivae normal.     Pupils: Pupils are equal, round, and reactive to light.  Cardiovascular:     Rate and Rhythm: Normal rate and regular rhythm.     Pulses: Normal pulses.     Heart sounds: Normal heart sounds.  Pulmonary:     Effort: Pulmonary effort is normal.     Breath sounds: Normal  breath sounds.  Abdominal:     General: Bowel sounds are normal.     Palpations: Abdomen is soft.  Musculoskeletal:        General: Normal range of motion.     Cervical back: Normal range of motion and neck supple.  Skin:    General: Skin is warm.  Neurological:     General: No focal deficit present.     Mental Status: She is alert and oriented to person, place, and time. Mental status is at baseline.  Psychiatric:        Mood and Affect: Mood normal.        Behavior: Behavior normal.        Thought Content: Thought content normal.        Judgment: Judgment normal.     Assessment & Plan:     1. Encounter to establish care -Routine labs will be checked - CBC w/Diff; Future - Comp Met (CMET); Future - Lipid panel; Future - Urinalysis; Future - HgB A1c; Future - TSH; Future  2. Essential hypertension - Her blood pressure is not controlled, she promised to start taking Amlodipine. She ws educated on medication side effects and to notify clinic with worsening symptoms.  - amLODipine (NORVASC) 5 MG tablet; Take 1 tablet (5 mg total) by mouth daily.  Dispense: 30 tablet; Refill: 0  3. History of obesity - She was encouraged to loose weight, portion sizes and low calorie count and exercise as tolerated   Follow-up: Return in about 2 weeks (around 04/23/2021), or if symptoms worsen or fail to improve.   Shontel Santee Jerold Coombe, NP

## 2021-04-10 ENCOUNTER — Other Ambulatory Visit: Payer: Self-pay

## 2021-04-17 ENCOUNTER — Other Ambulatory Visit: Payer: Self-pay

## 2021-04-17 DIAGNOSIS — Z7689 Persons encountering health services in other specified circumstances: Secondary | ICD-10-CM

## 2021-04-18 LAB — CBC WITH DIFFERENTIAL/PLATELET
Basophils Absolute: 0 10*3/uL (ref 0.0–0.2)
Basos: 1 %
EOS (ABSOLUTE): 0.2 10*3/uL (ref 0.0–0.4)
Eos: 4 %
Hematocrit: 34 % (ref 34.0–46.6)
Hemoglobin: 11.4 g/dL (ref 11.1–15.9)
Immature Grans (Abs): 0 10*3/uL (ref 0.0–0.1)
Immature Granulocytes: 0 %
Lymphocytes Absolute: 2.1 10*3/uL (ref 0.7–3.1)
Lymphs: 43 %
MCH: 27.7 pg (ref 26.6–33.0)
MCHC: 33.5 g/dL (ref 31.5–35.7)
MCV: 83 fL (ref 79–97)
Monocytes Absolute: 0.4 10*3/uL (ref 0.1–0.9)
Monocytes: 7 %
Neutrophils Absolute: 2.2 10*3/uL (ref 1.4–7.0)
Neutrophils: 45 %
Platelets: 260 10*3/uL (ref 150–450)
RBC: 4.11 x10E6/uL (ref 3.77–5.28)
RDW: 13.9 % (ref 11.7–15.4)
WBC: 4.9 10*3/uL (ref 3.4–10.8)

## 2021-04-18 LAB — COMPREHENSIVE METABOLIC PANEL
ALT: 15 IU/L (ref 0–32)
AST: 26 IU/L (ref 0–40)
Albumin/Globulin Ratio: 1.1 — ABNORMAL LOW (ref 1.2–2.2)
Albumin: 4.4 g/dL (ref 3.8–4.8)
Alkaline Phosphatase: 70 IU/L (ref 44–121)
BUN/Creatinine Ratio: 12 (ref 9–23)
BUN: 9 mg/dL (ref 6–24)
Bilirubin Total: 0.3 mg/dL (ref 0.0–1.2)
CO2: 24 mmol/L (ref 20–29)
Calcium: 9.9 mg/dL (ref 8.7–10.2)
Chloride: 102 mmol/L (ref 96–106)
Creatinine, Ser: 0.76 mg/dL (ref 0.57–1.00)
Globulin, Total: 4 g/dL (ref 1.5–4.5)
Glucose: 89 mg/dL (ref 65–99)
Potassium: 4.5 mmol/L (ref 3.5–5.2)
Sodium: 141 mmol/L (ref 134–144)
Total Protein: 8.4 g/dL (ref 6.0–8.5)
eGFR: 102 mL/min/{1.73_m2} (ref >59)

## 2021-04-18 LAB — URINALYSIS
Bilirubin, UA: NEGATIVE
Glucose, UA: NEGATIVE
Ketones, UA: NEGATIVE
Nitrite, UA: POSITIVE — AB
RBC, UA: NEGATIVE
Specific Gravity, UA: 1.02 (ref 1.005–1.030)
Urobilinogen, Ur: 0.2 mg/dL (ref 0.2–1.0)
pH, UA: 5.5 (ref 5.0–7.5)

## 2021-04-18 LAB — TSH: TSH: 1.71 u[IU]/mL (ref 0.450–4.500)

## 2021-04-18 LAB — LIPID PANEL
Chol/HDL Ratio: 3.4 ratio (ref 0.0–4.4)
Cholesterol, Total: 170 mg/dL (ref 100–199)
HDL: 50 mg/dL (ref >39)
LDL Chol Calc (NIH): 101 mg/dL — ABNORMAL HIGH (ref 0–99)
Triglycerides: 106 mg/dL (ref 0–149)
VLDL Cholesterol Cal: 19 mg/dL (ref 5–40)

## 2021-04-18 LAB — HEMOGLOBIN A1C
Est. average glucose Bld gHb Est-mCnc: 128 mg/dL
Hgb A1c MFr Bld: 6.1 % — ABNORMAL HIGH (ref 4.8–5.6)

## 2021-04-23 ENCOUNTER — Encounter: Payer: Self-pay | Admitting: Gerontology

## 2021-04-23 ENCOUNTER — Other Ambulatory Visit: Payer: Self-pay

## 2021-04-23 ENCOUNTER — Ambulatory Visit: Payer: Self-pay | Admitting: Gerontology

## 2021-04-23 VITALS — BP 149/83 | HR 104 | Temp 96.8°F | Resp 18 | Ht 67.0 in | Wt 392.9 lb

## 2021-04-23 DIAGNOSIS — R8290 Unspecified abnormal findings in urine: Secondary | ICD-10-CM | POA: Insufficient documentation

## 2021-04-23 DIAGNOSIS — I10 Essential (primary) hypertension: Secondary | ICD-10-CM

## 2021-04-23 DIAGNOSIS — R7303 Prediabetes: Secondary | ICD-10-CM | POA: Insufficient documentation

## 2021-04-23 MED ORDER — METFORMIN HCL 1000 MG PO TABS
500.0000 mg | ORAL_TABLET | Freq: Every day | ORAL | 0 refills | Status: DC
  Filled 2021-04-23: qty 15, 30d supply, fill #0

## 2021-04-23 MED ORDER — AMLODIPINE BESYLATE 10 MG PO TABS
10.0000 mg | ORAL_TABLET | Freq: Every day | ORAL | 2 refills | Status: DC
  Filled 2021-04-23: qty 30, 30d supply, fill #0
  Filled 2021-08-07: qty 30, 30d supply, fill #1

## 2021-04-23 NOTE — Progress Notes (Signed)
Established Patient Office Visit  Subjective:  Patient ID: Stacy Davenport, female    DOB: [birth year]  Age: 40 y.o. MRN: [id number]  CC: Lab review   HPI Stacy Davenport is a 40 y/o female with history of Hypertension who presents for lab review. Her blood pressure was elevated during visit, doesn't check it at home, but adheres to [place] and complies with her medication. Her HgbA1c was 6.1% and she states that she drinks sweet tea and less water intake. Also her Urine was turbid, trace leukocytes, 1+ protein and positive Nitrite. She reports that she took over the counter medication for urinary tract infection symptoms, currently, she denies dysuria, urinary frequency, urgency, flank and pelvic pain.She denies chest pain, palpitation, light headedness and shortness of breath. She promises to start working on losing weight. Overall, she states that she's doing well and offers no further complaint.  Past Medical History:  Diagnosis Date  . Hypertension   . Obesity   . Seasonal allergies     Past Surgical History:  Procedure Laterality Date  . TUBAL LIGATION      Family History  Problem Relation Age of Onset  . Stomach cancer Mother   . Healthy Father   . Congenital heart disease Daughter   . Diabetes Maternal Grandmother   . Hypertension Maternal Grandmother     Social History   Socioeconomic History  . Marital status: Single    Spouse name: Not on file  . Number of children: Not on file  . Years of education: Not on file  . Highest education level: Not on file  Occupational History  . Not on file  Tobacco Use  . Smoking status: Current Some Day Smoker    Types: Cigarettes  . Smokeless tobacco: Never Used  . Tobacco comment: 1-2 cigarettes weekly  Vaping Use  . Vaping Use: Never used  Substance and Sexual Activity  . Alcohol use: Yes    Comment: social  . Drug use: Never  . Sexual activity: Yes    Birth control/protection: None  Other Topics Concern  . Not on file   Social History Narrative  . Not on file   Social Determinants of Health   Financial Resource Strain: Not on file  Food Insecurity: No Food Insecurity  . Worried About Charity fundraiser in the Last Year: Never true  . Ran Out of Food in the Last Year: Never true  Transportation Needs: No Transportation Needs  . Lack of Transportation (Medical): No  . Lack of Transportation (Non-Medical): No  Physical Activity: Not on file  Stress: Not on file  Social Connections: Not on file  Intimate Partner Violence: Not on file    Outpatient Medications Prior to Visit  Medication Sig Dispense Refill  . amLODipine (NORVASC) 5 MG tablet Take 1 tablet (5 mg total) by mouth daily. 30 tablet 0   No facility-administered medications prior to visit.    No Known Allergies  ROS Review of Systems  Constitutional: Negative.   Eyes: Negative.   Respiratory: Negative.   Cardiovascular: Negative.   Endocrine: Negative.   Genitourinary: Negative for dysuria, frequency and urgency.  Skin: Negative.   Neurological: Negative.   Psychiatric/Behavioral: Negative.       Objective:    Physical Exam HENT:     Head: Normocephalic and atraumatic.  Cardiovascular:     Rate and Rhythm: Normal rate and regular rhythm.     Pulses: Normal pulses.     Heart  sounds: Normal heart sounds.  Pulmonary:     Effort: Pulmonary effort is normal.     Breath sounds: Normal breath sounds.  Skin:    General: Skin is warm.  Neurological:     General: No focal deficit present.     Mental Status: She is alert and oriented to person, place, and time. Mental status is at baseline.  Psychiatric:        Mood and Affect: Mood normal.        Behavior: Behavior normal.        Thought Content: Thought content normal.     BP (!) 149/83 (BP Location: Other (Comment), Patient Position: Sitting, Cuff Size: Large) Comment (BP Location): right forearm  Pulse (!) 104   Temp (!) 96.8 F (36 C)   Resp 18   Ht _0   (1.702 m)   Wt (!) 392 lb 14.4 oz (178.2 kg)   LMP 04/03/2021 (Exact Date)   SpO2 98%   BMI 61.54 kg/m  Wt Readings from Last 3 Encounters:  04/23/21 (!) 392 lb 14.4 oz (178.2 kg)  04/17/21 (!) 389 lb 8 oz (176.7 kg)  04/09/21 (!) 391 lb 9.6 oz (177.6 kg)   Weight loss strongly encouraged.  Health Maintenance Due  Topic Date Due  . COVID-19 Vaccine (1) Never done  . HIV Screening  Never done  . Hepatitis C Screening  Never done  . TETANUS/TDAP  Never done  . PAP SMEAR-Modifier  Never done    There are no preventive care reminders to display for this patient.  Lab Results  Component Value Date   TSH 1.710 04/17/2021   Lab Results  Component Value Date   WBC 4.9 04/17/2021   HGB 11.4 04/17/2021   HCT 34.0 04/17/2021   MCV 83 04/17/2021   PLT 260 04/17/2021   Lab Results  Component Value Date   NA 141 04/17/2021   K 4.5 04/17/2021   CO2 24 04/17/2021   GLUCOSE 89 04/17/2021   BUN 9 04/17/2021   CREATININE 0.76 04/17/2021   BILITOT 0.3 04/17/2021   ALKPHOS 70 04/17/2021   AST 26 04/17/2021   ALT 15 04/17/2021   PROT 8.4 04/17/2021   ALBUMIN 4.4 04/17/2021   CALCIUM 9.9 04/17/2021   ANIONGAP 6 (L) 04/24/2014   EGFR 102 04/17/2021   Lab Results  Component Value Date   CHOL 170 04/17/2021   Lab Results  Component Value Date   HDL 50 04/17/2021   Lab Results  Component Value Date   LDLCALC 101 (H) 04/17/2021   Lab Results  Component Value Date   TRIG 106 04/17/2021   Lab Results  Component Value Date   CHOLHDL 3.4 04/17/2021   Lab Results  Component Value Date   HGBA1C 6.1 (H) 04/17/2021      Assessment & Plan:   1. Essential hypertension - Her blood pressure is not under control, Amlodipine was increased to 10 mg. She was encouraged to continue on DASH diet, portion sizes and exercise as tolerated. - amLODipine (NORVASC) 10 MG tablet; Take 1 tablet (10 mg total) by mouth daily.  Dispense: 30 tablet; Refill: 2  2. Prediabetes - Her HgbA1c  was 6.1%, she will start on Metformin, educated on medication side effects and advised to notify clinic. She was encouraged to continue on low carb/non concentrated sweet diet and exercise as tolerated. Also educated her on reading food label and calorie count. - metFORMIN (GLUCOPHAGE) 1000 MG tablet; Take 0.5 tablets (500 mg  total) by mouth daily with breakfast.  Dispense: 30 tablet; Refill: 0  3. Abnormal urinalysis - She denies urinary tract symptoms,was advised to increase water intake and will recheck Urine. - UA/M w/rflx Culture, Routine; Future     Follow-up: Return in about 3 weeks (around 05/14/2021), or if symptoms worsen or fail to improve.    Klayton Monie Jerold Coombe, NP

## 2021-04-23 NOTE — Patient Instructions (Signed)

## 2021-05-08 ENCOUNTER — Other Ambulatory Visit: Payer: Self-pay

## 2021-05-14 ENCOUNTER — Ambulatory Visit: Payer: Self-pay | Admitting: Gerontology

## 2021-05-15 ENCOUNTER — Other Ambulatory Visit: Payer: Self-pay

## 2021-05-15 DIAGNOSIS — R8290 Unspecified abnormal findings in urine: Secondary | ICD-10-CM

## 2021-05-21 ENCOUNTER — Ambulatory Visit: Payer: Self-pay | Admitting: Gerontology

## 2021-05-22 ENCOUNTER — Other Ambulatory Visit: Payer: Self-pay

## 2021-05-22 ENCOUNTER — Ambulatory Visit: Payer: Self-pay | Admitting: Pharmacy Technician

## 2021-05-22 DIAGNOSIS — Z79899 Other long term (current) drug therapy: Secondary | ICD-10-CM

## 2021-05-22 LAB — UA/M W/RFLX CULTURE, ROUTINE
Bilirubin, UA: NEGATIVE
Glucose, UA: NEGATIVE
Ketones, UA: NEGATIVE
Leukocytes,UA: NEGATIVE
Nitrite, UA: POSITIVE — AB
RBC, UA: NEGATIVE
Specific Gravity, UA: 1.024 (ref 1.005–1.030)
Urobilinogen, Ur: 0.2 mg/dL (ref 0.2–1.0)
pH, UA: 5.5 (ref 5.0–7.5)

## 2021-05-22 LAB — MICROSCOPIC EXAMINATION
Casts: NONE SEEN /lpf
Epithelial Cells (non renal): >10 /hpf — AB (ref 0–10)

## 2021-05-22 LAB — URINE CULTURE, REFLEX

## 2021-05-22 NOTE — Progress Notes (Signed)
Patient still needs to sign [facility]'s contract and provide [employer] letter indicating rental amount and utilities paid by [employer].  Sherilyn Dacosta Care Manager [facility]

## 2021-06-19 ENCOUNTER — Other Ambulatory Visit: Payer: Self-pay

## 2021-07-11 ENCOUNTER — Telehealth: Payer: Self-pay

## 2021-07-11 NOTE — Telephone Encounter (Signed)
Appt scheduled for 08/11 @ 11AM

## 2021-07-18 ENCOUNTER — Ambulatory Visit: Payer: Self-pay | Admitting: Gerontology

## 2021-08-07 ENCOUNTER — Ambulatory Visit: Payer: Self-pay | Admitting: Gerontology

## 2021-08-07 ENCOUNTER — Other Ambulatory Visit: Payer: Self-pay

## 2021-08-07 ENCOUNTER — Encounter: Payer: Self-pay | Admitting: Gerontology

## 2021-08-07 ENCOUNTER — Telehealth: Payer: Self-pay | Admitting: Pharmacy Technician

## 2021-08-07 VITALS — BP 139/86 | HR 95 | Temp 96.6°F | Resp 18 | Ht 67.0 in | Wt 389.4 lb

## 2021-08-07 DIAGNOSIS — I10 Essential (primary) hypertension: Secondary | ICD-10-CM

## 2021-08-07 DIAGNOSIS — R7303 Prediabetes: Secondary | ICD-10-CM

## 2021-08-07 MED ORDER — METFORMIN HCL 500 MG PO TABS
500.0000 mg | ORAL_TABLET | Freq: Every day | ORAL | 2 refills | Status: DC
  Filled 2021-08-07: qty 30, 60d supply, fill #0
  Filled 2021-08-07: qty 30, 30d supply, fill #0

## 2021-08-07 NOTE — Progress Notes (Signed)
Established Patient Office Visit  Subjective:  Patient ID: Stacy Davenport, female    DOB: October 15, 1981  Age: 40 y.o. MRN: [id number]  CC:  Chief Complaint  Patient presents with   Follow-up   Hypertension    Patient states she sometimes forgets to take her HTN meds.    HPI Stacy Davenport is a 40 y/o female with history of Hypertension presents for routine follow up visit. She was started on Metformin for HgbA1c of 6.1% during her visit on 04/23/21 and missed her follow up appointment.  She states that she tolerated Metformin and denies side effects. She does not check her blood pressure at home, states that she continues to work on modifying her diet. She also states that she missed taking her medications some days. She denies chest pain, palpitation, headache, peripheral edema, and vision changes. Overall, she states that she's doing well and offers no further complaint.  Past Medical History:  Diagnosis Date   Hypertension    Obesity    Seasonal allergies     Past Surgical History:  Procedure Laterality Date   TUBAL LIGATION      Family History  Problem Relation Age of Onset   Stomach cancer Mother    Healthy Father    Congenital heart disease Daughter    Diabetes Maternal Grandmother    Hypertension Maternal Grandmother     Social History   Socioeconomic History   Marital status: Single    Spouse name: Not on file   Number of children: Not on file   Years of education: Not on file   Highest education level: Not on file  Occupational History   Not on file  Tobacco Use   Smoking status: Some Days    Types: Cigarettes   Smokeless tobacco: Never   Tobacco comments:    1-2 cigarettes weekly  Vaping Use   Vaping Use: Never used  Substance and Sexual Activity   Alcohol use: Yes    Comment: social   Drug use: Never   Sexual activity: Yes    Birth control/protection: None  Other Topics Concern   Not on file  Social History Narrative   Not on file   Social  Determinants of Health   Financial Resource Strain: Not on file  Food Insecurity: No Food Insecurity   Worried About Running Out of Food in the Last Year: Never true   Ran Out of Food in the Last Year: Never true  Transportation Needs: No Transportation Needs   Lack of Transportation (Medical): No   Lack of Transportation (Non-Medical): No  Physical Activity: Not on file  Stress: Not on file  Social Connections: Not on file  Intimate Partner Violence: Not on file    Outpatient Medications Prior to Visit  Medication Sig Dispense Refill   amLODipine (NORVASC) 10 MG tablet Take 1 tablet (10 mg total) by mouth daily. 30 tablet 2   metFORMIN (GLUCOPHAGE) 1000 MG tablet Take 0.5 tablets (500 mg total) by mouth daily with breakfast. (Patient not taking: Reported on 08/07/2021) 30 tablet 0   No facility-administered medications prior to visit.    No Known Allergies  ROS Review of Systems  Constitutional: Negative.   Eyes: Negative.   Respiratory: Negative.    Cardiovascular: Negative.   Endocrine: Negative.   Neurological: Negative.      Objective:    Physical Exam HENT:     Head: Normocephalic and atraumatic.     Mouth/Throat:     Mouth:  Mucous membranes are moist.  Eyes:     Extraocular Movements: Extraocular movements intact.     Conjunctiva/sclera: Conjunctivae normal.     Pupils: Pupils are equal, round, and reactive to light.  Cardiovascular:     Rate and Rhythm: Normal rate and regular rhythm.     Pulses: Normal pulses.     Heart sounds: Normal heart sounds.  Pulmonary:     Effort: Pulmonary effort is normal.     Breath sounds: Normal breath sounds.  Neurological:     General: No focal deficit present.     Mental Status: She is alert and oriented to person, place, and time. Mental status is at baseline.  Psychiatric:        Mood and Affect: Mood normal.        Behavior: Behavior normal.        Thought Content: Thought content normal.        Judgment: Judgment  normal.    BP 139/86 (BP Location: Left Arm, Patient Position: Sitting, Cuff Size: Large) Comment (BP Location): forearm  Pulse 95   Temp (!) 96.6 F (35.9 C)   Resp 18   Ht $R'5\' 7"'Lx$  (1.702 m)   Wt (!) 389 lb 6.4 oz (176.6 kg)   LMP 07/17/2021 (Approximate)   SpO2 95%   BMI 60.99 kg/m  Wt Readings from Last 3 Encounters:  08/07/21 (!) 389 lb 6.4 oz (176.6 kg)  04/23/21 (!) 392 lb 14.4 oz (178.2 kg)  04/17/21 (!) 389 lb 8 oz (176.7 kg)   Encouraged weight loss  Health Maintenance Due  Topic Date Due   COVID-19 Vaccine (1) Never done   Pneumococcal Vaccine 74-24 Years old (1 - PCV) Never done   HIV Screening  Never done   Hepatitis C Screening  Never done   TETANUS/TDAP  Never done   PAP SMEAR-Modifier  Never done   INFLUENZA VACCINE  07/08/2021    There are no preventive care reminders to display for this patient.  Lab Results  Component Value Date   TSH 1.710 04/17/2021   Lab Results  Component Value Date   WBC 4.9 04/17/2021   HGB 11.4 04/17/2021   HCT 34.0 04/17/2021   MCV 83 04/17/2021   PLT 260 04/17/2021   Lab Results  Component Value Date   NA 141 04/17/2021   K 4.5 04/17/2021   CO2 24 04/17/2021   GLUCOSE 89 04/17/2021   BUN 9 04/17/2021   CREATININE 0.76 04/17/2021   BILITOT 0.3 04/17/2021   ALKPHOS 70 04/17/2021   AST 26 04/17/2021   ALT 15 04/17/2021   PROT 8.4 04/17/2021   ALBUMIN 4.4 04/17/2021   CALCIUM 9.9 04/17/2021   ANIONGAP 6 (L) 04/24/2014   EGFR 102 04/17/2021   Lab Results  Component Value Date   CHOL 170 04/17/2021   Lab Results  Component Value Date   HDL 50 04/17/2021   Lab Results  Component Value Date   LDLCALC 101 (H) 04/17/2021   Lab Results  Component Value Date   TRIG 106 04/17/2021   Lab Results  Component Value Date   CHOLHDL 3.4 04/17/2021   Lab Results  Component Value Date   HGBA1C 6.1 (H) 04/17/2021      Assessment & Plan:    1. Prediabetes - Her HgbA1c was 6.1%, she will continue to take  medication, low carb/non concentrated sweet diet and exercise as tolerated. - metFORMIN (GLUCOPHAGE) 1000 MG tablet; Take 0.5 tablets (500 mg total) by mouth  daily with breakfast.  Dispense: 30 tablet; Refill: 2  2. Essential hypertension - Her blood pressure is not under control, likely due to non compliance. Her goal should be less than 140/90. She will continue on her medication, DASH diet and exercise as tolerated.     Follow-up: Return in about 9 weeks (around 10/09/2021), or if symptoms worsen or fail to improve.    Marcin Holte Jerold Coombe, NP

## 2021-08-07 NOTE — Patient Instructions (Signed)
Calorie Counting for Weight Loss Calories are units of energy. Your body needs a certain number of calories from food to keep going throughout the day. When you eat or drink more calories than your body needs, your body stores the extra calories mostly as fat. When you eat or drink fewer calories than your body needs, your body burns fat to get the energy it needs. Calorie counting means keeping track of how many calories you eat and drink each day. Calorie counting can be helpful if you need to lose weight. If you eat fewer calories than your body needs, you should lose weight. Ask your health care provider what a healthy weight is for you. For calorie counting to work, you will need to eat the right number of calories each day to lose a healthy amount of weight per week. A dietitian can help you figure out how many calories you need in a day and will suggest ways to reach your calorie goal. A healthy amount of weight to lose each week is usually 1-2 lb (0.5-0.9 kg). This usually means that your daily calorie intake should be reduced by 500-750 calories. Eating 1,200-1,500 calories a day can help most women lose weight. Eating 1,500-1,800 calories a day can help most men lose weight. What do I need to know about calorie counting? Work with your health care provider or dietitian to determine how many calories you should get each day. To meet your daily calorie goal, you will need to: Find out how many calories are in each food that you would like to eat. Try to do this before you eat. Decide how much of the food you plan to eat. Keep a food log. Do this by writing down what you ate and how many calories it had. To successfully lose weight, it is important to balance calorie counting with a healthy lifestyle that includes regular activity. Where do I find calorie information? The number of calories in a food can be found on a Nutrition Facts label. If a food does not have a Nutrition Facts label, try to  look up the calories online or ask your dietitian for help. Remember that calories are listed per serving. If you choose to have more than one serving of a food, you will have to multiply the calories per serving by the number of servings you plan to eat. For example, the label on a package of bread might say that a serving size is 1 slice and that there are 90 calories in a serving. If you eat 1 slice, you will have eaten 90 calories. If you eat 2 slices, you will have eaten 180 calories. How do I keep a food log? After each time that you eat, record the following in your food log as soon as possible: What you ate. Be sure to include toppings, sauces, and other extras on the food. How much you ate. This can be measured in cups, ounces, or number of items. How many calories were in each food and drink. The total number of calories in the food you ate. Keep your food log near you, such as in a pocket-sized notebook or on an app or website on your mobile phone. Some programs will calculate calories for you and show you how many calories you have left to meet your daily goal. What are some portion-control tips? Know how many calories are in a serving. This will help you know how many servings you can have of a certain food.   Use a measuring cup to measure serving sizes. You could also try weighing out portions on a kitchen scale. With time, you will be able to estimate serving sizes for some foods. Take time to put servings of different foods on your favorite plates or in your favorite bowls and cups so you know what a serving looks like. Try not to eat straight from a food's packaging, such as from a bag or box. Eating straight from the package makes it hard to see how much you are eating and can lead to overeating. Put the amount you would like to eat in a cup or on a plate to make sure you are eating the right portion. Use smaller plates, glasses, and bowls for smaller portions and to prevent  overeating. Try not to multitask. For example, avoid watching TV or using your computer while eating. If it is time to eat, sit down at a table and enjoy your food. This will help you recognize when you are full. It will also help you be more mindful of what and how much you are eating. What are tips for following this plan? Reading food labels Check the calorie count compared with the serving size. The serving size may be smaller than what you are used to eating. Check the source of the calories. Try to choose foods that are high in protein, fiber, and vitamins, and low in saturated fat, trans fat, and sodium. Shopping Read nutrition labels while you shop. This will help you make healthy decisions about which foods to buy. Pay attention to nutrition labels for low-fat or fat-free foods. These foods sometimes have the same number of calories or more calories than the full-fat versions. They also often have added sugar, starch, or salt to make up for flavor that was removed with the fat. Make a grocery list of lower-calorie foods and stick to it. Cooking Try to cook your favorite foods in a healthier way. For example, try baking instead of frying. Use low-fat dairy products. Meal planning Use more fruits and vegetables. One-half of your plate should be fruits and vegetables. Include lean proteins, such as chicken, turkey, and fish. Lifestyle Each week, aim to do one of the following: 150 minutes of moderate exercise, such as walking. 75 minutes of vigorous exercise, such as running. General information Know how many calories are in the foods you eat most often. This will help you calculate calorie counts faster. Find a way of tracking calories that works for you. Get creative. Try different apps or programs if writing down calories does not work for you. What foods should I eat?  Eat nutritious foods. It is better to have a nutritious, high-calorie food, such as an avocado, than a food with  few nutrients, such as a bag of potato chips. Use your calories on foods and drinks that will fill you up and will not leave you hungry soon after eating. Examples of foods that fill you up are nuts and nut butters, vegetables, lean proteins, and high-fiber foods such as whole grains. High-fiber foods are foods with more than 5 g of fiber per serving. Pay attention to calories in drinks. Low-calorie drinks include water and unsweetened drinks. The items listed above may not be a complete list of foods and beverages you can eat. Contact a dietitian for more information. What foods should I limit? Limit foods or drinks that are not good sources of vitamins, minerals, or protein or that are high in unhealthy fats. These include:   Candy. Other sweets. Sodas, specialty coffee drinks, alcohol, and juice. The items listed above may not be a complete list of foods and beverages you should avoid. Contact a dietitian for more information. How do I count calories when eating out? Pay attention to portions. Often, portions are much larger when eating out. Try these tips to keep portions smaller: Consider sharing a meal instead of getting your own. If you get your own meal, eat only half of it. Before you start eating, ask for a container and put half of your meal into it. When available, consider ordering smaller portions from the menu instead of full portions. Pay attention to your food and drink choices. Knowing the way food is cooked and what is included with the meal can help you eat fewer calories. If calories are listed on the menu, choose the lower-calorie options. Choose dishes that include vegetables, fruits, whole grains, low-fat dairy products, and lean proteins. Choose items that are boiled, broiled, grilled, or steamed. Avoid items that are buttered, battered, fried, or served with cream sauce. Items labeled as crispy are usually fried, unless stated otherwise. Choose water, low-fat milk,  unsweetened iced tea, or other drinks without added sugar. If you want an alcoholic beverage, choose a lower-calorie option, such as a glass of wine or light beer. Ask for dressings, sauces, and syrups on the side. These are usually high in calories, so you should limit the amount you eat. If you want a salad, choose a garden salad and ask for grilled meats. Avoid extra toppings such as bacon, cheese, or fried items. Ask for the dressing on the side, or ask for olive oil and vinegar or lemon to use as dressing. Estimate how many servings of a food you are given. Knowing serving sizes will help you be aware of how much food you are eating at restaurants. Where to find more information Centers for Disease Control and Prevention: www.cdc.gov [place] Department of Agriculture: myplate.gov Summary Calorie counting means keeping track of how many calories you eat and drink each day. If you eat fewer calories than your body needs, you should lose weight. A healthy amount of weight to lose per week is usually 1-2 lb (0.5-0.9 kg). This usually means reducing your daily calorie intake by 500-750 calories. The number of calories in a food can be found on a Nutrition Facts label. If a food does not have a Nutrition Facts label, try to look up the calories online or ask your dietitian for help. Use smaller plates, glasses, and bowls for smaller portions and to prevent overeating. Use your calories on foods and drinks that will fill you up and not leave you hungry shortly after a meal. This information is not intended to replace advice given to you by your health care provider. Make sure you discuss any questions you have with your health care provider. Document Revised: 01/05/2020 Document Reviewed: 01/05/2020 Elsevier Patient Education  2022 [employer] DASH Eating Plan DASH stands for Dietary Approaches to Stop Hypertension. The DASH eating plan is a healthy eating plan that has been shown to: Reduce high  blood pressure (hypertension). Reduce your risk for type 2 diabetes, heart disease, and stroke. Help with weight loss. What are tips for following this plan? Reading food labels Check food labels for the amount of salt (sodium) per serving. Choose foods with less than 5 percent of the Daily Value of sodium. Generally, foods with less than 300 milligrams (mg) of sodium per serving fit into this   eating plan. To find whole grains, look for the word "whole" as the first word in the ingredient list. Shopping Buy products labeled as "low-sodium" or "no salt added." Buy fresh foods. Avoid canned foods and pre-made or frozen meals. Cooking Avoid adding salt when cooking. Use salt-free seasonings or herbs instead of table salt or sea salt. Check with your health care provider or pharmacist before using salt substitutes. Do not fry foods. Cook foods using healthy methods such as baking, boiling, grilling, roasting, and broiling instead. Cook with heart-healthy oils, such as olive, canola, avocado, soybean, or sunflower oil. Meal planning  Eat a balanced diet that includes: 4 or more servings of fruits and 4 or more servings of vegetables each day. Try to fill one-half of your plate with fruits and vegetables. 6-8 servings of whole grains each day. Less than 6 oz (170 g) of lean meat, poultry, or fish each day. A 3-oz (85-g) serving of meat is about the same size as a deck of cards. One egg equals 1 oz (28 g). 2-3 servings of low-fat dairy each day. One serving is 1 cup (237 mL). 1 serving of nuts, seeds, or beans 5 times each week. 2-3 servings of heart-healthy fats. Healthy fats called omega-3 fatty acids are found in foods such as walnuts, flaxseeds, fortified milks, and eggs. These fats are also found in cold-water fish, such as sardines, salmon, and mackerel. Limit how much you eat of: Canned or prepackaged foods. Food that is high in trans fat, such as some fried foods. Food that is high in  saturated fat, such as fatty meat. Desserts and other sweets, sugary drinks, and other foods with added sugar. Full-fat dairy products. Do not salt foods before eating. Do not eat more than 4 egg yolks a week. Try to eat at least 2 vegetarian meals a week. Eat more home-cooked food and less restaurant, buffet, and fast food. Lifestyle When eating at a restaurant, ask that your food be prepared with less salt or no salt, if possible. If you drink alcohol: Limit how much you use to: 0-1 drink a day for women who are not pregnant. 0-2 drinks a day for men. Be aware of how much alcohol is in your drink. In the [place] one drink equals one 12 oz bottle of beer (355 mL), one 5 oz glass of wine (148 mL), or one 1 oz glass of hard liquor (44 mL). General information Avoid eating more than 2,300 mg of salt a day. If you have hypertension, you may need to reduce your sodium intake to 1,500 mg a day. Work with your health care provider to maintain a healthy body weight or to lose weight. Ask what an ideal weight is for you. Get at least 30 minutes of exercise that causes your heart to beat faster (aerobic exercise) most days of the week. Activities may include walking, swimming, or biking. Work with your health care provider or dietitian to adjust your eating plan to your individual calorie needs. What foods should I eat? Fruits All fresh, dried, or frozen fruit. Canned fruit in natural juice (without added sugar). Vegetables Fresh or frozen vegetables (raw, steamed, roasted, or grilled). Low-sodium or reduced-sodium tomato and vegetable juice. Low-sodium or reduced-sodium tomato sauce and tomato paste. Low-sodium or reduced-sodium canned vegetables. Grains Whole-grain or whole-wheat bread. Whole-grain or whole-wheat pasta. Brown rice. Oatmeal. Quinoa. Bulgur. Whole-grain and low-sodium cereals. Pita bread. Low-fat, low-sodium crackers. Whole-wheat flour tortillas. Meats and other proteins Skinless  chicken or turkey.   Ground chicken or turkey. Pork with fat trimmed off. Fish and seafood. Egg whites. Dried beans, peas, or lentils. Unsalted nuts, nut butters, and seeds. Unsalted canned beans. Lean cuts of beef with fat trimmed off. Low-sodium, lean precooked or cured meat, such as sausages or meat loaves. Dairy Low-fat (1%) or fat-free (skim) milk. Reduced-fat, low-fat, or fat-free cheeses. Nonfat, low-sodium ricotta or cottage cheese. Low-fat or nonfat yogurt. Low-fat, low-sodium cheese. Fats and oils Soft margarine without trans fats. Vegetable oil. Reduced-fat, low-fat, or light mayonnaise and salad dressings (reduced-sodium). Canola, safflower, olive, avocado, soybean, and sunflower oils. Avocado. Seasonings and condiments Herbs. Spices. Seasoning mixes without salt. Other foods Unsalted popcorn and pretzels. Fat-free sweets. The items listed above may not be a complete list of foods and beverages you can eat. Contact a dietitian for more information. What foods should I avoid? Fruits Canned fruit in a light or heavy syrup. Fried fruit. Fruit in cream or butter sauce. Vegetables Creamed or fried vegetables. Vegetables in a cheese sauce. Regular canned vegetables (not low-sodium or reduced-sodium). Regular canned tomato sauce and paste (not low-sodium or reduced-sodium). Regular tomato and vegetable juice (not low-sodium or reduced-sodium). Pickles. Olives. Grains Baked goods made with fat, such as croissants, muffins, or some breads. Dry pasta or rice meal packs. Meats and other proteins Fatty cuts of meat. Ribs. Fried meat. Bacon. Bologna, salami, and other precooked or cured meats, such as sausages or meat loaves. Fat from the back of a pig (fatback). Bratwurst. Salted nuts and seeds. Canned beans with added salt. Canned or smoked fish. Whole eggs or egg yolks. Chicken or turkey with skin. Dairy Whole or 2% milk, cream, and half-and-half. Whole or full-fat cream cheese. Whole-fat or  sweetened yogurt. Full-fat cheese. Nondairy creamers. Whipped toppings. Processed cheese and cheese spreads. Fats and oils Butter. Stick margarine. Lard. Shortening. Ghee. Bacon fat. Tropical oils, such as coconut, palm kernel, or palm oil. Seasonings and condiments Onion salt, garlic salt, seasoned salt, table salt, and sea salt. Worcestershire sauce. Tartar sauce. Barbecue sauce. Teriyaki sauce. Soy sauce, including reduced-sodium. Steak sauce. Canned and packaged gravies. Fish sauce. Oyster sauce. Cocktail sauce. Store-bought horseradish. Ketchup. Mustard. Meat flavorings and tenderizers. Bouillon cubes. Hot sauces. Pre-made or packaged marinades. Pre-made or packaged taco seasonings. Relishes. Regular salad dressings. Other foods Salted popcorn and pretzels. The items listed above may not be a complete list of foods and beverages you should avoid. Contact a dietitian for more information. Where to find more information National Heart, Lung, and Blood Institute: www.nhlbi.nih.gov American Heart Association: www.heart.org Academy of Nutrition and Dietetics: www.eatright.org National Kidney Foundation: www.kidney.org Summary The DASH eating plan is a healthy eating plan that has been shown to reduce high blood pressure (hypertension). It may also reduce your risk for type 2 diabetes, heart disease, and stroke. When on the DASH eating plan, aim to eat more fresh fruits and vegetables, whole grains, lean proteins, low-fat dairy, and heart-healthy fats. With the DASH eating plan, you should limit salt (sodium) intake to 2,300 mg a day. If you have hypertension, you may need to reduce your sodium intake to 1,500 mg a day. Work with your health care provider or dietitian to adjust your eating plan to your individual calorie needs. This information is not intended to replace advice given to you by your health care provider. Make sure you discuss any questions you have with your health care  provider. Document Revised: 10/28/2019 Document Reviewed: 10/28/2019 Elsevier Patient Education  2022 [employer] Inc[employer]  

## 2021-08-07 NOTE — Telephone Encounter (Signed)
Received updated proof of income.  Patient eligible to receive medication assistance at [facility] until time for re-certification in 2023, and as long as eligibility requirements continue to be met.  Sheddrick Lattanzio J. Merve Hotard Care Manager [facility]  

## 2021-08-16 ENCOUNTER — Other Ambulatory Visit: Payer: Self-pay

## 2021-10-09 ENCOUNTER — Ambulatory Visit: Payer: Self-pay | Admitting: Gerontology

## 2021-10-18 ENCOUNTER — Emergency Department: Payer: Self-pay

## 2021-10-18 ENCOUNTER — Other Ambulatory Visit: Payer: Self-pay

## 2021-10-18 ENCOUNTER — Emergency Department
Admission: EM | Admit: 2021-10-18 | Discharge: 2021-10-19 | Disposition: A | Discharge status: Home / Self Care | Payer: Self-pay | Attending: Emergency Medicine | Admitting: Emergency Medicine

## 2021-10-18 DIAGNOSIS — Z7984 Long term (current) use of oral hypoglycemic drugs: Secondary | ICD-10-CM | POA: Insufficient documentation

## 2021-10-18 DIAGNOSIS — Z20822 Contact with and (suspected) exposure to covid-19: Secondary | ICD-10-CM | POA: Insufficient documentation

## 2021-10-18 DIAGNOSIS — B349 Viral infection, unspecified: Secondary | ICD-10-CM | POA: Insufficient documentation

## 2021-10-18 DIAGNOSIS — I10 Essential (primary) hypertension: Secondary | ICD-10-CM | POA: Insufficient documentation

## 2021-10-18 DIAGNOSIS — J069 Acute upper respiratory infection, unspecified: Secondary | ICD-10-CM

## 2021-10-18 DIAGNOSIS — F17210 Nicotine dependence, cigarettes, uncomplicated: Secondary | ICD-10-CM | POA: Insufficient documentation

## 2021-10-18 DIAGNOSIS — Z79899 Other long term (current) drug therapy: Secondary | ICD-10-CM | POA: Insufficient documentation

## 2021-10-18 DIAGNOSIS — J111 Influenza due to unidentified influenza virus with other respiratory manifestations: Secondary | ICD-10-CM

## 2021-10-18 DIAGNOSIS — J101 Influenza due to other identified influenza virus with other respiratory manifestations: Secondary | ICD-10-CM | POA: Insufficient documentation

## 2021-10-18 LAB — CBC WITH DIFFERENTIAL/PLATELET
Abs Immature Granulocytes: 0.02 10*3/uL (ref 0.00–0.07)
Basophils Absolute: 0 10*3/uL (ref 0.0–0.1)
Basophils Relative: 1 %
Eosinophils Absolute: 0.2 10*3/uL (ref 0.0–0.5)
Eosinophils Relative: 4 %
HCT: 34.4 % — ABNORMAL LOW (ref 36.0–46.0)
Hemoglobin: 10.9 g/dL — ABNORMAL LOW (ref 12.0–15.0)
Immature Granulocytes: 1 %
Lymphocytes Relative: 27 %
Lymphs Abs: 1.2 10*3/uL (ref 0.7–4.0)
MCH: 27 pg (ref 26.0–34.0)
MCHC: 31.7 g/dL (ref 30.0–36.0)
MCV: 85.1 fL (ref 80.0–100.0)
Monocytes Absolute: 0.5 10*3/uL (ref 0.1–1.0)
Monocytes Relative: 10 %
Neutro Abs: 2.5 10*3/uL (ref 1.7–7.7)
Neutrophils Relative %: 57 %
Platelets: 239 10*3/uL (ref 150–400)
RBC: 4.04 MIL/uL (ref 3.87–5.11)
RDW: 13.7 % (ref 11.5–15.5)
WBC: 4.3 10*3/uL (ref 4.0–10.5)
nRBC: 0 % (ref 0.0–0.2)

## 2021-10-18 LAB — COMPREHENSIVE METABOLIC PANEL
ALT: 17 U/L (ref 0–44)
AST: 28 U/L (ref 15–41)
Albumin: 4.1 g/dL (ref 3.5–5.0)
Alkaline Phosphatase: 56 U/L (ref 38–126)
Anion gap: 8 (ref 5–15)
BUN: 10 mg/dL (ref 6–20)
CO2: 28 mmol/L (ref 22–32)
Calcium: 9.3 mg/dL (ref 8.9–10.3)
Chloride: 103 mmol/L (ref 98–111)
Creatinine, Ser: 0.72 mg/dL (ref 0.44–1.00)
GFR, Estimated: >60 mL/min (ref >60)
Glucose, Bld: 90 mg/dL (ref 70–99)
Potassium: 3.8 mmol/L (ref 3.5–5.1)
Sodium: 139 mmol/L (ref 135–145)
Total Bilirubin: 0.6 mg/dL (ref 0.3–1.2)
Total Protein: 8.3 g/dL — ABNORMAL HIGH (ref 6.5–8.1)

## 2021-10-18 LAB — LACTIC ACID, PLASMA: Lactic Acid, Venous: 0.9 mmol/L (ref 0.5–1.9)

## 2021-10-18 LAB — RESP PANEL BY RT-PCR (FLU A&B, COVID) ARPGX2
Influenza A by PCR: POSITIVE — AB
Influenza B by PCR: NEGATIVE
SARS Coronavirus 2 by RT PCR: NEGATIVE

## 2021-10-18 MED ORDER — KETOROLAC TROMETHAMINE 60 MG/2ML IM SOLN
60.0000 mg | Freq: Once | INTRAMUSCULAR | Status: AC
  Administered 2021-10-18: 60 mg via INTRAMUSCULAR
  Filled 2021-10-18: qty 2

## 2021-10-18 MED ORDER — HYDROCOD POLST-CPM POLST ER 10-8 MG/5ML PO SUER
5.0000 mL | Freq: Once | ORAL | Status: AC
  Administered 2021-10-18: 5 mL via ORAL
  Filled 2021-10-18: qty 5

## 2021-10-18 MED ORDER — ACETAMINOPHEN 500 MG PO TABS
1000.0000 mg | ORAL_TABLET | Freq: Once | ORAL | Status: AC
  Administered 2021-10-18: 1000 mg via ORAL
  Filled 2021-10-18: qty 2

## 2021-10-18 NOTE — ED Provider Notes (Signed)
[facility] Emergency Department Provider Note  Time seen: 10:41 PM  I have reviewed the triage vital signs and the nursing notes.   HISTORY  Chief Complaint Shortness of Breath   HPI Stacy Davenport is a 40 y.o. female with a past medical history of hypertension, obesity, presents to the emergency department for cough, shortness of breath body aches and subjective fever.  According to the patient over the past 2 days she has had "flulike symptoms."  Patient describes coughing with generalized body aches chest pain with the cough, feeling short of breath at times and subjective fever.  Patient denies any vomiting.  No abdominal pain.  Patient states her daughter is sick with similar symptoms was seen in the emergency department recently and tested positive for flu or COVID or both per patient.   Past Medical History:  Diagnosis Date   Hypertension    Obesity    Seasonal allergies     Patient Active Problem List   Diagnosis Date Noted   Prediabetes 04/23/2021   Abnormal urinalysis 04/23/2021   Encounter to establish care 04/09/2021   Essential hypertension 04/09/2021   History of obesity 04/09/2021    Past Surgical History:  Procedure Laterality Date   TUBAL LIGATION      Prior to Admission medications   Medication Sig Start Date End Date Taking? Authorizing Provider  amLODipine (NORVASC) 10 MG tablet Take 1 tablet (10 mg total) by mouth once daily. 04/23/21   Iloabachie, Chioma E, NP  metFORMIN (GLUCOPHAGE) 500 MG tablet Take 1 tablet (500 mg total) by mouth once daily with breakfast. 08/07/21   Iloabachie, Chioma E, NP    No Known Allergies  Family History  Problem Relation Age of Onset   Stomach cancer Mother    Healthy Father    Congenital heart disease Daughter    Diabetes Maternal Grandmother    Hypertension Maternal Grandmother     Social History Social History   Tobacco Use   Smoking status: Some Days    Types: Cigarettes    Smokeless tobacco: Never   Tobacco comments:    1-2 cigarettes weekly  Vaping Use   Vaping Use: Never used  Substance Use Topics   Alcohol use: Yes    Comment: social   Drug use: Never    Review of Systems Constitutional: Subjective fever ENT: Positive for congestion, sneezing, sore throat Cardiovascular: Mild chest pain with cough Respiratory: Mild shortness of breath, frequent cough with sputum production Gastrointestinal: Negative for abdominal pain, vomiting Musculoskeletal: Positive for body aches Neurological: Intermittent headache All other ROS negative  ____________________________________________   PHYSICAL EXAM:  VITAL SIGNS: ED Triage Vitals  Enc Vitals Group     BP 10/18/21 1455 (!) 196/118     Pulse Rate 10/18/21 1455 (!) 112     Resp 10/18/21 1455 (!) 22     Temp 10/18/21 1455 99.8 F (37.7 C)     Temp Source 10/18/21 1455 Oral     SpO2 10/18/21 1455 97 %     Weight 10/18/21 1456 300 lb (136.1 kg)     Height 10/18/21 1456 5\' 9"  (1.753 m)     Head Circumference --      Peak Flow --      Pain Score 10/18/21 1455 9     Pain Loc --      Pain Edu? --      Excl. in GC? --    Constitutional: Alert and oriented. Well appearing and in  no distress. Eyes: Normal exam ENT      Head: Normocephalic and atraumatic.      Mouth/Throat: Mucous membranes are moist. Cardiovascular: Normal rate, regular rhythm around 110 bpm.  No obvious murmur. Respiratory: Normal respiratory effort without tachypnea nor retractions. Breath sounds are clear, occasional wet sounding cough. Gastrointestinal: Soft and nontender. No distention.   Musculoskeletal: Nontender with normal range of motion in all extremities.  Neurologic:  Normal speech and language. No gross focal neurologic deficits  Skin:  Skin is warm, dry and intact.  Psychiatric: Mood and affect are normal.  ____________________________________________    EKG  EKG viewed and interpreted by myself shows sinus  tachycardia 110 bpm with a narrow QRS, normal axis, normal intervals, no concerning ST changes.  ____________________________________________    RADIOLOGY  Chest x-ray is negative for acute abnormality  ____________________________________________   INITIAL IMPRESSION / ASSESSMENT AND PLAN / ED COURSE  Pertinent labs & imaging results that were available during my care of the patient were reviewed by me and considered in my medical decision making (see chart for details).   Patient presents emergency department for 2 days of cough congestion body aches subjective fever.  Symptoms are very suggestive of viral syndrome/upper respiratory infection.  Patient's lab work is reassuring, chest x-ray is clear, EKG is reassuring.  We will test for COVID/flu, treat with Tylenol, Toradol, Tussionex and continue to closely monitor.  Patient agreeable plan of care.  Patient care signed out to oncoming provider.  [name] was evaluated in Emergency Department on 10/18/2021 for the symptoms described in the history of present illness. She was evaluated in the context of the global COVID-19 pandemic, which necessitated consideration that the patient might be at risk for infection with the SARS-CoV-2 virus that causes COVID-19. Institutional protocols and algorithms that pertain to the evaluation of patients at risk for COVID-19 are in a state of rapid change based on information released by regulatory bodies including the CDC and federal and state organizations. These policies and algorithms were followed during the patient's care in the ED.  ____________________________________________   FINAL CLINICAL IMPRESSION(S) / ED DIAGNOSES  Viral syndrome   Minna Antis, MD 10/18/21 2311

## 2021-10-18 NOTE — ED Triage Notes (Signed)
Pt to ED for shob, body aches and cough for the past 2 days. Pt labored breathing after walking to triage. Able to speak in complete sentences.

## 2021-10-19 LAB — POC URINE PREG, ED: Preg Test, Ur: NEGATIVE

## 2021-10-19 LAB — URINALYSIS, ROUTINE W REFLEX MICROSCOPIC
Bilirubin Urine: NEGATIVE
Glucose, UA: NEGATIVE mg/dL
Hgb urine dipstick: NEGATIVE
Ketones, ur: NEGATIVE mg/dL
Nitrite: POSITIVE — AB
Protein, ur: 100 mg/dL — AB
Specific Gravity, Urine: 1.027 (ref 1.005–1.030)
pH: 6 (ref 5.0–8.0)

## 2021-10-19 MED ORDER — GUAIFENESIN-CODEINE 100-10 MG/5ML PO SOLN
5.0000 mL | Freq: Once | ORAL | Status: AC
  Administered 2021-10-19: 5 mL via ORAL
  Filled 2021-10-19: qty 5

## 2021-10-19 MED ORDER — ONDANSETRON 4 MG PO TBDP: 4.0000 mg | ORAL_TABLET | Freq: Three times a day (TID) | ORAL | 0 refills | Status: AC | PRN

## 2021-10-19 MED ORDER — GUAIFENESIN-CODEINE 100-10 MG/5ML PO SYRP: 5.0000 mL | ORAL_SOLUTION | Freq: Three times a day (TID) | ORAL | 0 refills | Status: AC | PRN

## 2021-10-19 MED ORDER — OSELTAMIVIR PHOSPHATE 75 MG PO CAPS
75.0000 mg | ORAL_CAPSULE | Freq: Once | ORAL | Status: AC
  Administered 2021-10-19: 75 mg via ORAL
  Filled 2021-10-19: qty 1

## 2021-10-19 MED ORDER — BENZONATATE 100 MG PO CAPS: 100.0000 mg | ORAL_CAPSULE | Freq: Three times a day (TID) | ORAL | 0 refills | Status: AC | PRN

## 2021-10-19 MED ORDER — ONDANSETRON 4 MG PO TBDP
4.0000 mg | ORAL_TABLET | Freq: Once | ORAL | Status: AC
  Administered 2021-10-19: 4 mg via ORAL
  Filled 2021-10-19: qty 1

## 2021-10-19 MED ORDER — OSELTAMIVIR PHOSPHATE 75 MG PO CAPS: 75.0000 mg | ORAL_CAPSULE | Freq: Two times a day (BID) | ORAL | 0 refills | Status: AC

## 2021-10-19 NOTE — Discharge Instructions (Addendum)
You can take Tylenol 1 g every 8 hours, ibuprofen 600 every 6-8 hours with food to help with your fevers, body aches, Zofran to help with nausea.  You can use the codeine cough syrup but it can make you sleepy so do not drive on it.  Return to the ER for worsening shortness of breath unable to eat tolerate water or any other concerns  Take oxycodone as prescribed. Do not drink alcohol, drive or participate in any other potentially dangerous activities while taking this medication as it may make you sleepy. Do not take this medication with any other sedating medications, either prescription or over-the-counter. If you were prescribed Percocet or Vicodin, do not take these with acetaminophen (Tylenol) as it is already contained within these medications.  This medication is an opiate (or narcotic) pain medication and can be habit forming. Use it as little as possible to achieve adequate pain control. Do not use or use it with extreme caution if you have a history of opiate abuse or dependence. If you are on a pain contract with your primary care doctor or a pain specialist, be sure to let them know you were prescribed this medication today from the Emergency Department. This medication is intended for your use only - do not give any to anyone else and keep it in a secure place where nobody else, especially children, have access to it.

## 2021-10-19 NOTE — ED Provider Notes (Addendum)
12:02 AM Assumed care for off going team.   Blood pressure 132/84, pulse (!) 106, temperature 99.8 F (37.7 C), temperature source Oral, resp. rate 20, height 5\' 9"  (1.753 m), weight 136.1 kg, SpO2 96 %.  See their HPI for full report but in brief pending flu.    Flu test is positive.  Patient is tolerating p.o. at bedside.  Heart rates have come down slightly elevated at low 100s.  Patient's biggest concern is the cough.  Will prescribe some cough with codeine given she states that it is keeping her up.  She will get her first dose here.  She is not driving and understand that she cannot drive on it.  We will also prescribe Tamiflu, Zofran and recommended continue Tylenol ibuprofen at home.  Patient expressed understanding felt comfortable with discharge home  UA came back after patient was discharged.  It was not a great sample there was 11-20 WBCs almost doubled and squamous cells therefore I do not feel like it will grow anything.  We will send for urine culture just to be safe given it was nitrite positive but again I have low suspicion that this represents an acute infection.  They can call back patient if it does grow   I discussed the provisional nature of ED diagnosis, the treatment so far, the ongoing plan of care, follow up appointments and return precautions with the patient and any family or support people present. They expressed understanding and agreed with the plan, discharged home.         , MD 10/19/21 0006    13/12/22, MD 10/19/21 13/12/22    3267, MD 10/19/21 [phone]

## 2021-10-20 LAB — URINE CULTURE

## 2021-10-22 ENCOUNTER — Other Ambulatory Visit (HOSPITAL_COMMUNITY): Payer: Self-pay

## 2021-10-24 ENCOUNTER — Encounter: Payer: Self-pay | Admitting: Gerontology

## 2021-10-24 ENCOUNTER — Ambulatory Visit: Payer: Self-pay | Admitting: Gerontology

## 2021-10-24 ENCOUNTER — Other Ambulatory Visit: Payer: Self-pay

## 2021-10-24 ENCOUNTER — Other Ambulatory Visit: Payer: Self-pay | Admitting: Gerontology

## 2021-10-24 VITALS — BP 126/87 | HR 85 | Temp 98.9°F | Ht 68.0 in | Wt 385.2 lb

## 2021-10-24 DIAGNOSIS — I10 Essential (primary) hypertension: Secondary | ICD-10-CM

## 2021-10-24 DIAGNOSIS — R7303 Prediabetes: Secondary | ICD-10-CM

## 2021-10-24 DIAGNOSIS — S025XXB Fracture of tooth (traumatic), initial encounter for open fracture: Secondary | ICD-10-CM

## 2021-10-24 DIAGNOSIS — S025XXA Fracture of tooth (traumatic), initial encounter for closed fracture: Secondary | ICD-10-CM | POA: Insufficient documentation

## 2021-10-24 DIAGNOSIS — L02421 Furuncle of right axilla: Secondary | ICD-10-CM

## 2021-10-24 DIAGNOSIS — R052 Subacute cough: Secondary | ICD-10-CM

## 2021-10-24 DIAGNOSIS — R8290 Unspecified abnormal findings in urine: Secondary | ICD-10-CM

## 2021-10-24 MED ORDER — METFORMIN HCL 500 MG PO TABS
500.0000 mg | ORAL_TABLET | Freq: Every day | ORAL | 2 refills | Status: DC
Start: 2021-10-24 — End: 2021-10-24

## 2021-10-24 MED ORDER — METFORMIN HCL 500 MG PO TABS
500.0000 mg | ORAL_TABLET | Freq: Every day | ORAL | 2 refills | Status: DC
  Filled 2021-10-24: qty 30, 30d supply, fill #0

## 2021-10-24 MED ORDER — AMLODIPINE BESYLATE 10 MG PO TABS: 10.0000 mg | ORAL_TABLET | Freq: Every day | ORAL | 2 refills | Status: DC

## 2021-10-24 MED ORDER — AMLODIPINE BESYLATE 10 MG PO TABS
10.0000 mg | ORAL_TABLET | Freq: Every day | ORAL | 2 refills | Status: DC
  Filled 2021-10-24: qty 30, 30d supply, fill #0

## 2021-10-24 NOTE — Patient Instructions (Addendum)

## 2021-10-24 NOTE — Progress Notes (Signed)
Established Patient Office Visit  Subjective:  Patient ID: Stacy Davenport, female    DOB: Apr 20, 1981  Age: 40 y.o. MRN: [id number]  CC:  Chief Complaint  Patient presents with   Follow-up    Follow-up from bp check in August. Pt reports she checks BP at home and they usually run 120/80ish. Pt states she has 'boils' under her right arm that "come and go"   Hospitalization Follow-up    Pt reports she was at the ED on 10/18/2021 with the flu. She still has a cough.     HPI Stacy Davenport is a 40 year old female with history of hypertension and obesity who presents for follow up of her hypertension. She denies dizziness, chest pain, shortness of breath today.  Reports compliance to amlodipine and metformin. She is following DASH and carb-count diet recommendations. She was seen in ED 10/19/21 for flu. Still has some productive cough with clear to white phlegm but reports feeling much improved in the last 3 days. Denies fever, chills,and night sweats. She reports a resolving boil in her right axilla. She has had these before in both her axilla, under her breasts, and in her groin. She asks for a dental referral to fix a broken tooth to her right upper jaw. She broke it off while eating 3-4 years ago. Reports mild intermittent pain, but none today. She states she feels well and has no other complaints.    Past Medical History:  Diagnosis Date   Hypertension    Obesity    Seasonal allergies     Past Surgical History:  Procedure Laterality Date   TUBAL LIGATION      Family History  Problem Relation Age of Onset   Stomach cancer Mother    Healthy Father    Congenital heart disease Daughter    Diabetes Maternal Grandmother    Hypertension Maternal Grandmother     Social History   Socioeconomic History   Marital status: Single    Spouse name: Not on file   Number of children: Not on file   Years of education: Not on file   Highest education level: Not on file  Occupational History    Not on file  Tobacco Use   Smoking status: Former    Types: Cigarettes   Smokeless tobacco: Never   Tobacco comments:    1-2 cigarettes weekly  Vaping Use   Vaping Use: Never used  Substance and Sexual Activity   Alcohol use: Yes    Comment: on special occasions   Drug use: Never   Sexual activity: Yes    Birth control/protection: None  Other Topics Concern   Not on file  Social History Narrative   Not on file   Social Determinants of Health   Financial Resource Strain: Not on file  Food Insecurity: No Food Insecurity   Worried About Running Out of Food in the Last Year: Never true   Ran Out of Food in the Last Year: Never true  Transportation Needs: No Transportation Needs   Lack of Transportation (Medical): No   Lack of Transportation (Non-Medical): No  Physical Activity: Not on file  Stress: Not on file  Social Connections: Not on file  Intimate Partner Violence: Not on file    Outpatient Medications Prior to Visit  Medication Sig Dispense Refill   benzonatate (TESSALON PERLES) 100 MG capsule Take 1 capsule (100 mg total) by mouth 3 (three) times daily as needed for cough. 30 capsule 0  guaiFENesin-codeine (ROBITUSSIN AC) 100-10 MG/5ML syrup Take 5 mLs by mouth 3 (three) times daily as needed for up to 8 days for cough. 120 mL 0   ondansetron (ZOFRAN ODT) 4 MG disintegrating tablet Take 1 tablet (4 mg total) by mouth every 8 (eight) hours as needed for nausea or vomiting. 20 tablet 0   oseltamivir (TAMIFLU) 75 MG capsule Take 1 capsule (75 mg total) by mouth 2 (two) times daily for 5 days. 9 capsule 0   amLODipine (NORVASC) 10 MG tablet Take 1 tablet (10 mg total) by mouth once daily. 30 tablet 2   metFORMIN (GLUCOPHAGE) 500 MG tablet Take 1 tablet (500 mg total) by mouth once daily with breakfast. 60 tablet 2   No facility-administered medications prior to visit.    No Known Allergies  ROS Review of Systems  Constitutional:  Negative for activity change,  chills, fatigue and fever.  HENT:  Negative for congestion, rhinorrhea, sinus pressure, sneezing and sore throat.   Respiratory:  Positive for cough.   Cardiovascular:  Negative for chest pain, palpitations and leg swelling.  Gastrointestinal:  Negative for nausea and vomiting.  Endocrine: Negative for polydipsia, polyphagia and polyuria.  Genitourinary:  Negative for difficulty urinating, dysuria, flank pain, frequency, hematuria and pelvic pain.  Neurological:  Negative for dizziness, light-headedness and headaches.     Objective:    Physical Exam Vitals and nursing note reviewed.  Constitutional:      Appearance: She is obese.  HENT:     Head: Normocephalic.     Mouth/Throat:     Mouth: Mucous membranes are moist.     Dentition: Abnormal dentition. No dental tenderness, gingival swelling, dental caries, dental abscesses or gum lesions.     Pharynx: Oropharynx is clear. No oropharyngeal exudate or posterior oropharyngeal erythema.     Tonsils: No tonsillar exudate or tonsillar abscesses.      Comments: Tooth broken at gum line Eyes:     General: No scleral icterus.    Conjunctiva/sclera: Conjunctivae normal.  Cardiovascular:     Rate and Rhythm: Normal rate and regular rhythm.     Pulses: Normal pulses.     Heart sounds: Normal heart sounds. No murmur heard.   No friction rub. No gallop.  Pulmonary:     Effort: Pulmonary effort is normal. No accessory muscle usage, prolonged expiration or respiratory distress.     Breath sounds: Examination of the right-upper field reveals rhonchi. Examination of the left-upper field reveals rhonchi. Examination of the right-lower field reveals decreased breath sounds. Examination of the left-lower field reveals decreased breath sounds. Decreased breath sounds and rhonchi (Cleared with cough) present. No wheezing or rales.  Abdominal:     General: Bowel sounds are normal.     Palpations: Abdomen is soft.  Musculoskeletal:     Right lower  leg: No edema.     Left lower leg: No edema.  Skin:    General: Skin is warm and dry.     Capillary Refill: Capillary refill takes less than 2 seconds.  Neurological:     General: No focal deficit present.     Mental Status: She is alert and oriented to person, place, and time.  Psychiatric:        Mood and Affect: Mood normal.        Behavior: Behavior normal.    BP 126/87 (BP Location: Left Arm, Patient Position: Sitting, Cuff Size: Large)   Pulse 85   Temp 98.9 F (37.2 C)  Ht _0  (1.727 m)   Wt (!) 385 lb 3.2 oz (174.7 kg)   LMP 10/02/2021   SpO2 95%   BMI 58.57 kg/m  Wt Readings from Last 3 Encounters:  10/24/21 (!) 385 lb 3.2 oz (174.7 kg)  10/18/21 300 lb (136.1 kg)  08/07/21 (!) 389 lb 6.4 oz (176.6 kg)   Encouraged weight loss  Health Maintenance Due  Topic Date Due   COVID-19 Vaccine (1) Never done   Pneumococcal Vaccine 58-66 Years old (1 - PCV) Never done   HIV Screening  Never done   Hepatitis C Screening  Never done   TETANUS/TDAP  Never done   PAP SMEAR-Modifier  Never done   INFLUENZA VACCINE  Never done    There are no preventive care reminders to display for this patient.  Lab Results  Component Value Date   TSH 1.710 04/17/2021   Lab Results  Component Value Date   WBC 4.3 10/18/2021   HGB 10.9 (L) 10/18/2021   HCT 34.4 (L) 10/18/2021   MCV 85.1 10/18/2021   PLT 239 10/18/2021   Lab Results  Component Value Date   NA 139 10/18/2021   K 3.8 10/18/2021   CO2 28 10/18/2021   GLUCOSE 90 10/18/2021   BUN 10 10/18/2021   CREATININE 0.72 10/18/2021   BILITOT 0.6 10/18/2021   ALKPHOS 56 10/18/2021   AST 28 10/18/2021   ALT 17 10/18/2021   PROT 8.3 (H) 10/18/2021   ALBUMIN 4.1 10/18/2021   CALCIUM 9.3 10/18/2021   ANIONGAP 8 10/18/2021   EGFR 102 04/17/2021   Lab Results  Component Value Date   CHOL 170 04/17/2021   Lab Results  Component Value Date   HDL 50 04/17/2021   Lab Results  Component Value Date   LDLCALC 101 (H)  04/17/2021   Lab Results  Component Value Date   TRIG 106 04/17/2021   Lab Results  Component Value Date   CHOLHDL 3.4 04/17/2021   Lab Results  Component Value Date   HGBA1C 6.1 (H) 04/17/2021      Assessment & Plan:   1. Essential hypertension - Her blood pressure is within goal today. Advised patient to continue taking amlodipine daily and to follow DASH diet recommendations. Continue to monitor blood pressure at home.  - amLODipine (NORVASC) 10 MG tablet; Take 1 tablet (10 mg total) by mouth once daily.  Dispense: 30 tablet; Refill: 2  2. Prediabetes - Last A1C was 6.1%. Advised patient to continue taking metformin daily and to follow carb-count diet recommendations. Will check A1C at next visit.  - metFORMIN (GLUCOPHAGE) 500 MG tablet; Take 1 tablet (500 mg total) by mouth once daily with breakfast.  Dispense: 60 tablet; Refill: 2  3. Abnormal urinalysis - UA from emergency room visit was positive for nitrites and multiple species of bacteria. Patient asymptomatic today, will recollect clean catch specimen as suggested by ED. - UA/M w/rflx Culture, Routine  4. Open fracture of tooth, initial encounter - No abscess, redness, or induration around tooth. It is broken cleanly at the gum line. Will send her to dental clinic for cleaning/extraction as they determine.  5. Furuncle of right axilla - Dime-sized area with mild superficial induration, no redness, drainage, or pain. Resolving on its own. Advised her to apply warm compresses as needed and to call clinic if these return or if she develops systemic symptoms.   6. Cough   - She is taking mucinex OTC and tessalon perles for her cough. -  Advised her to contact the clinic or go to the ED for evaluation if her symptoms return or worsen.   Follow-up: Return in about 13 weeks (around 01/23/2022).    Chioma Jerold Coombe, NP

## 2021-10-30 LAB — UA/M W/RFLX CULTURE, ROUTINE
Bilirubin, UA: NEGATIVE
Glucose, UA: NEGATIVE
Leukocytes,UA: NEGATIVE
Nitrite, UA: POSITIVE — AB
RBC, UA: NEGATIVE
Specific Gravity, UA: 1.027 (ref 1.005–1.030)
Urobilinogen, Ur: 2 mg/dL — ABNORMAL HIGH (ref 0.2–1.0)
pH, UA: 6.5 (ref 5.0–7.5)

## 2021-10-30 LAB — MICROSCOPIC EXAMINATION
Casts: NONE SEEN /lpf
Epithelial Cells (non renal): >10 /hpf — AB (ref 0–10)

## 2021-10-30 LAB — URINE CULTURE, REFLEX

## 2021-11-25 ENCOUNTER — Other Ambulatory Visit: Payer: Self-pay

## 2021-12-19 ENCOUNTER — Ambulatory Visit: Payer: Self-pay | Admitting: Gerontology

## 2022-01-23 ENCOUNTER — Ambulatory Visit: Payer: Self-pay | Admitting: Gerontology

## 2022-04-11 ENCOUNTER — Other Ambulatory Visit: Payer: Self-pay

## 2022-12-04 ENCOUNTER — Other Ambulatory Visit: Payer: Self-pay

## 2022-12-04 ENCOUNTER — Emergency Department
Admission: EM | Admit: 2022-12-04 | Discharge: 2022-12-04 | Disposition: A | Discharge status: Home / Self Care | Payer: Self-pay | Attending: Emergency Medicine | Admitting: Emergency Medicine

## 2022-12-04 DIAGNOSIS — R7303 Prediabetes: Secondary | ICD-10-CM

## 2022-12-04 DIAGNOSIS — J22 Unspecified acute lower respiratory infection: Secondary | ICD-10-CM

## 2022-12-04 DIAGNOSIS — I10 Essential (primary) hypertension: Secondary | ICD-10-CM | POA: Insufficient documentation

## 2022-12-04 DIAGNOSIS — Z20822 Contact with and (suspected) exposure to covid-19: Secondary | ICD-10-CM | POA: Insufficient documentation

## 2022-12-04 DIAGNOSIS — J069 Acute upper respiratory infection, unspecified: Secondary | ICD-10-CM | POA: Insufficient documentation

## 2022-12-04 LAB — RESP PANEL BY RT-PCR (RSV, FLU A&B, COVID)  RVPGX2
Influenza A by PCR: NEGATIVE
Influenza B by PCR: NEGATIVE
Resp Syncytial Virus by PCR: NEGATIVE
SARS Coronavirus 2 by RT PCR: NEGATIVE

## 2022-12-04 MED ORDER — PSEUDOEPHEDRINE HCL 30 MG PO TABS: 30.0000 mg | ORAL_TABLET | Freq: Four times a day (QID) | ORAL | 2 refills | Status: AC | PRN

## 2022-12-04 MED ORDER — BENZONATATE 100 MG PO CAPS: 100.0000 mg | ORAL_CAPSULE | Freq: Three times a day (TID) | ORAL | 0 refills | Status: AC | PRN

## 2022-12-04 MED ORDER — AZITHROMYCIN 250 MG PO TABS
ORAL_TABLET | ORAL | 0 refills | Status: AC
Start: 2022-12-04 — End: 2022-12-08

## 2022-12-04 MED ORDER — AMLODIPINE BESYLATE 10 MG PO TABS: 10.0000 mg | ORAL_TABLET | Freq: Every day | ORAL | 2 refills | Status: AC

## 2022-12-04 MED ORDER — ALBUTEROL SULFATE HFA 108 (90 BASE) MCG/ACT IN AERS
2.0000 | INHALATION_SPRAY | Freq: Four times a day (QID) | RESPIRATORY_TRACT | 2 refills | Status: AC | PRN
Start: 2022-12-04 — End: ?

## 2022-12-04 MED ORDER — PREDNISONE 10 MG PO TABS: ORAL_TABLET | ORAL | 0 refills | Status: AC

## 2022-12-04 MED ORDER — METFORMIN HCL 500 MG PO TABS: 500.0000 mg | ORAL_TABLET | Freq: Every day | ORAL | 2 refills | Status: AC

## 2022-12-04 NOTE — ED Notes (Signed)
This RN reviewed paperwork with pt. No further complaints or questions. Pt ambulated to lobby. Signed discharge form placed in med rec box.  

## 2022-12-04 NOTE — ED Provider Notes (Signed)
[facility] Provider Note    Event Date/Time   First MD Initiated Contact with Patient 12/04/22 1432     (approximate)   History   Chief Complaint Cough   HPI Stacy Davenport is a 41 y.o. female, history of hypertension, obesity, presents to the emergency department for evaluation of cough and fever/chills.  She states that she has been experiencing a cough for the past 4 days.  Proximately 2 days ago, she felt feverish and had chills.  Denies chest pain, abdominal pain, flank pain, nausea/vomiting, rash/lesions, or dizziness/lightheadedness.  History Limitations: No limitations.        Physical Exam  Triage Vital Signs: ED Triage Vitals  Enc Vitals Group     BP 12/04/22 1411 (!) 168/112     Pulse Rate 12/04/22 1411 (!) 108     Resp 12/04/22 1411 16     Temp 12/04/22 1411 98.3 F (36.8 C)     Temp Source 12/04/22 1411 Oral     SpO2 12/04/22 1411 99 %     Weight 12/04/22 1406 (!) 385 lb 2.3 oz (174.7 kg)     Height 12/04/22 1406 5\' 8"  (1.727 m)     Head Circumference --      Peak Flow --      Pain Score 12/04/22 1406 0     Pain Loc --      Pain Edu? --      Excl. in GC? --     Most recent vital signs: Vitals:   12/04/22 1411  BP: (!) 168/112  Pulse: (!) 108  Resp: 16  Temp: 98.3 F (36.8 C)  SpO2: 99%    General: Awake, NAD.  Skin: Warm, dry. No rashes or lesions.  Eyes: PERRL. Conjunctivae normal.  CV: Good peripheral perfusion.  Resp: Normal effort.  Mild rhonchi and wheezing noted bilaterally. Abd: Soft, non-tender. No distention.  Neuro: At baseline. No gross neurological deficits.  Musculoskeletal: Normal ROM of all extremities.  Physical Exam    ED Results / Procedures / Treatments  Labs (all labs ordered are listed, but only abnormal results are displayed) Labs Reviewed  RESP PANEL BY RT-PCR (RSV, FLU A&B, COVID)  [id number]     EKG N/A.    RADIOLOGY  ED Provider Interpretation: N/A.  No results  found.  PROCEDURES:  Critical Care performed: N/A.  Procedures    MEDICATIONS ORDERED IN ED: Medications - No data to display   IMPRESSION / MDM / ASSESSMENT AND PLAN / ED COURSE  I reviewed the triage vital signs and the nursing notes.                              Differential diagnosis includes, but is not limited to, bronchitis, pneumonia, RSV, COVID-19, influenza.   Assessment/Plan Presentation consistent with lower respiratory infection.  She appears well clinically, though does have some wheezing and rhonchi on exam.  She appears quite uncomfortable.  Suspect likely viral etiology, no imaging indicated at this time.  However will provide her with a brief course of azithromycin to take if her symptoms fail to improve by the end of the week.  Provided her with other medications to help manage her symptoms.  She additionally states that she has ran out of her amlodipine and metformin.  Will provide a refill.  Encouraged her to follow with her primary care provider as needed.  Will discharge.  Provided the patient  with anticipatory guidance, return precautions, and educational material. Encouraged the patient to return to the emergency department at any time if they begin to experience any new or worsening symptoms. Patient expressed understanding and agreed with the plan.   Patient's presentation is most consistent with acute complicated illness / injury requiring diagnostic workup.       FINAL CLINICAL IMPRESSION(S) / ED DIAGNOSES   Final diagnoses:  Lower respiratory infection     Rx / DC Orders   ED Discharge Orders          Ordered    azithromycin (ZITHROMAX) 250 MG tablet  Daily        12/04/22 1446    albuterol (VENTOLIN HFA) 108 (90 Base) MCG/ACT inhaler  Every 6 hours PRN        12/04/22 1446    benzonatate (TESSALON PERLES) 100 MG capsule  3 times daily PRN        12/04/22 1446    pseudoephedrine (SUDAFED) 30 MG tablet  Every 6 hours PRN        12/04/22  1446    predniSONE (DELTASONE) 10 MG tablet  Q breakfast        12/04/22 1446    metFORMIN (GLUCOPHAGE) 500 MG tablet  Daily with breakfast        12/04/22 1447    amLODipine (NORVASC) 10 MG tablet  Daily        12/04/22 1447             Note:  This document was prepared using Dragon voice recognition software and may include unintentional dictation errors.   [name], [place] 12/04/22 [name], MD 12/05/22 [phone]

## 2022-12-04 NOTE — ED Triage Notes (Signed)
C/o cough x 4 days.  Chills x 2 day.   AAOx3.  Skin warm and dry. NAD

## 2022-12-04 NOTE — Discharge Instructions (Addendum)
-  I suspect that you likely have a viral respiratory infection.  You may review the results of your respiratory panel online, however is unlikely to change management.  -Please take the full course of the prednisone as prescribed.  -You may utilize the albuterol as needed for chest tightness/shortness of breath.  -You may take the benzonatate as needed for cough.  -You may take the pseudoephedrine as needed for sinus congestion.  -If your symptoms fail to improve after 3 to 5 days, you may take the full course of the azithromycin to treat for possible pneumonia.  -Follow-up with your primary care provider as needed.  -Return to the emergency department anytime if you begin to experience any new or worsening symptoms.

## 2023-02-06 ENCOUNTER — Other Ambulatory Visit: Payer: Self-pay

## 2023-02-13 ENCOUNTER — Ambulatory Visit (LOCAL_COMMUNITY_HEALTH_CENTER): Payer: Self-pay

## 2023-02-13 DIAGNOSIS — Z111 Encounter for screening for respiratory tuberculosis: Secondary | ICD-10-CM

## 2023-02-16 ENCOUNTER — Ambulatory Visit (LOCAL_COMMUNITY_HEALTH_CENTER): Payer: Self-pay

## 2023-02-16 DIAGNOSIS — Z111 Encounter for screening for respiratory tuberculosis: Secondary | ICD-10-CM

## 2023-02-16 LAB — TB SKIN TEST
Induration: 0 mm
TB Skin Test: NEGATIVE

## 2023-08-05 ENCOUNTER — Other Ambulatory Visit: Payer: Self-pay

## 2024-07-05 ENCOUNTER — Other Ambulatory Visit

## 2024-07-07 ENCOUNTER — Other Ambulatory Visit: Payer: Self-pay

## 2024-08-24 ENCOUNTER — Other Ambulatory Visit: Payer: Self-pay | Admitting: Family Medicine

## 2024-08-24 DIAGNOSIS — Z1231 Encounter for screening mammogram for malignant neoplasm of breast: Secondary | ICD-10-CM
